# Patient Record
Sex: Female | Born: 1935 | Race: White | Hispanic: No | Marital: Single | Smoking: Never smoker
Health system: Southern US, Community
[De-identification: ages and names within clinical notes are randomized; demographics above are authoritative.]

## PROBLEM LIST (undated history)

## (undated) DIAGNOSIS — I10 Essential (primary) hypertension: Secondary | ICD-10-CM

## (undated) DIAGNOSIS — M1990 Unspecified osteoarthritis, unspecified site: Secondary | ICD-10-CM

## (undated) DIAGNOSIS — F419 Anxiety disorder, unspecified: Secondary | ICD-10-CM

## (undated) DIAGNOSIS — J189 Pneumonia, unspecified organism: Secondary | ICD-10-CM

## (undated) DIAGNOSIS — F329 Major depressive disorder, single episode, unspecified: Secondary | ICD-10-CM

## (undated) DIAGNOSIS — F32A Depression, unspecified: Secondary | ICD-10-CM

---

## 2013-01-15 ENCOUNTER — Inpatient Hospital Stay (HOSPITAL_COMMUNITY): Payer: Medicare Other

## 2013-01-15 ENCOUNTER — Encounter (HOSPITAL_COMMUNITY): Payer: Self-pay | Admitting: *Deleted

## 2013-01-15 ENCOUNTER — Inpatient Hospital Stay (HOSPITAL_COMMUNITY)
Admission: EM | Admit: 2013-01-15 | Discharge: 2013-02-18 | DRG: 871 | Disposition: E | Payer: Medicare Other | Source: Other Acute Inpatient Hospital | Attending: Pulmonary Disease | Admitting: Pulmonary Disease

## 2013-01-15 DIAGNOSIS — H9190 Unspecified hearing loss, unspecified ear: Secondary | ICD-10-CM | POA: Diagnosis present

## 2013-01-15 DIAGNOSIS — E872 Acidosis, unspecified: Secondary | ICD-10-CM | POA: Diagnosis present

## 2013-01-15 DIAGNOSIS — F411 Generalized anxiety disorder: Secondary | ICD-10-CM | POA: Diagnosis present

## 2013-01-15 DIAGNOSIS — J690 Pneumonitis due to inhalation of food and vomit: Secondary | ICD-10-CM | POA: Diagnosis present

## 2013-01-15 DIAGNOSIS — I10 Essential (primary) hypertension: Secondary | ICD-10-CM | POA: Diagnosis present

## 2013-01-15 DIAGNOSIS — R6521 Severe sepsis with septic shock: Secondary | ICD-10-CM

## 2013-01-15 DIAGNOSIS — A419 Sepsis, unspecified organism: Principal | ICD-10-CM | POA: Diagnosis present

## 2013-01-15 DIAGNOSIS — R531 Weakness: Secondary | ICD-10-CM

## 2013-01-15 DIAGNOSIS — J80 Acute respiratory distress syndrome: Secondary | ICD-10-CM | POA: Diagnosis present

## 2013-01-15 DIAGNOSIS — G7281 Critical illness myopathy: Secondary | ICD-10-CM | POA: Diagnosis not present

## 2013-01-15 DIAGNOSIS — Z515 Encounter for palliative care: Secondary | ICD-10-CM

## 2013-01-15 DIAGNOSIS — D649 Anemia, unspecified: Secondary | ICD-10-CM | POA: Diagnosis present

## 2013-01-15 DIAGNOSIS — J9600 Acute respiratory failure, unspecified whether with hypoxia or hypercapnia: Secondary | ICD-10-CM

## 2013-01-15 DIAGNOSIS — G9340 Encephalopathy, unspecified: Secondary | ICD-10-CM | POA: Diagnosis present

## 2013-01-15 DIAGNOSIS — R079 Chest pain, unspecified: Secondary | ICD-10-CM | POA: Diagnosis present

## 2013-01-15 DIAGNOSIS — E870 Hyperosmolality and hypernatremia: Secondary | ICD-10-CM | POA: Diagnosis not present

## 2013-01-15 DIAGNOSIS — M129 Arthropathy, unspecified: Secondary | ICD-10-CM | POA: Diagnosis present

## 2013-01-15 DIAGNOSIS — E875 Hyperkalemia: Secondary | ICD-10-CM | POA: Diagnosis present

## 2013-01-15 DIAGNOSIS — R197 Diarrhea, unspecified: Secondary | ICD-10-CM | POA: Diagnosis not present

## 2013-01-15 DIAGNOSIS — J9601 Acute respiratory failure with hypoxia: Secondary | ICD-10-CM

## 2013-01-15 DIAGNOSIS — J189 Pneumonia, unspecified organism: Secondary | ICD-10-CM | POA: Diagnosis present

## 2013-01-15 DIAGNOSIS — N179 Acute kidney failure, unspecified: Secondary | ICD-10-CM | POA: Diagnosis present

## 2013-01-15 DIAGNOSIS — E876 Hypokalemia: Secondary | ICD-10-CM | POA: Diagnosis present

## 2013-01-15 DIAGNOSIS — E46 Unspecified protein-calorie malnutrition: Secondary | ICD-10-CM | POA: Diagnosis present

## 2013-01-15 DIAGNOSIS — J90 Pleural effusion, not elsewhere classified: Secondary | ICD-10-CM | POA: Diagnosis present

## 2013-01-15 HISTORY — DX: Depression, unspecified: F32.A

## 2013-01-15 HISTORY — DX: Essential (primary) hypertension: I10

## 2013-01-15 HISTORY — DX: Major depressive disorder, single episode, unspecified: F32.9

## 2013-01-15 HISTORY — DX: Anxiety disorder, unspecified: F41.9

## 2013-01-15 HISTORY — DX: Unspecified osteoarthritis, unspecified site: M19.90

## 2013-01-15 HISTORY — DX: Pneumonia, unspecified organism: J18.9

## 2013-01-15 LAB — CARBOXYHEMOGLOBIN
Methemoglobin: 1.7 % — ABNORMAL HIGH (ref 0.0–1.5)
O2 Saturation: 78.5 %

## 2013-01-15 LAB — COMPREHENSIVE METABOLIC PANEL
Albumin: 1.9 g/dL — ABNORMAL LOW (ref 3.5–5.2)
BUN: 45 mg/dL — ABNORMAL HIGH (ref 6–23)
Calcium: 7.2 mg/dL — ABNORMAL LOW (ref 8.4–10.5)
Creatinine, Ser: 1.4 mg/dL — ABNORMAL HIGH (ref 0.50–1.10)
Glucose, Bld: 104 mg/dL — ABNORMAL HIGH (ref 70–99)
Potassium: 5.5 mEq/L — ABNORMAL HIGH (ref 3.5–5.1)
Total Protein: 6.3 g/dL (ref 6.0–8.3)

## 2013-01-15 LAB — INFLUENZA PANEL BY PCR (TYPE A & B)
H1N1 flu by pcr: NOT DETECTED
Influenza B By PCR: NEGATIVE

## 2013-01-15 LAB — CBC WITH DIFFERENTIAL/PLATELET
Basophils Absolute: 0 10*3/uL (ref 0.0–0.1)
Eosinophils Absolute: 0 10*3/uL (ref 0.0–0.7)
Eosinophils Relative: 0 % (ref 0–5)
Lymphs Abs: 0.7 10*3/uL (ref 0.7–4.0)
MCH: 32.1 pg (ref 26.0–34.0)
MCHC: 32.8 g/dL (ref 30.0–36.0)
MCV: 98 fL (ref 78.0–100.0)
Monocytes Absolute: 1 10*3/uL (ref 0.1–1.0)
Platelets: 233 10*3/uL (ref 150–400)
RDW: 14.3 % (ref 11.5–15.5)
WBC Morphology: INCREASED

## 2013-01-15 LAB — BASIC METABOLIC PANEL
BUN: 34 mg/dL — ABNORMAL HIGH (ref 6–23)
CO2: 19 mEq/L (ref 19–32)
Calcium: 7.4 mg/dL — ABNORMAL LOW (ref 8.4–10.5)
Creatinine, Ser: 1.22 mg/dL — ABNORMAL HIGH (ref 0.50–1.10)
GFR calc non Af Amer: 40 mL/min — ABNORMAL LOW (ref >90)
Glucose, Bld: 103 mg/dL — ABNORMAL HIGH (ref 70–99)
Sodium: 139 mEq/L (ref 135–145)

## 2013-01-15 LAB — TROPONIN I
Troponin I: <0.3 ng/mL (ref <0.30)
Troponin I: <0.3 ng/mL (ref <0.30)
Troponin I: <0.3 ng/mL (ref <0.30)

## 2013-01-15 LAB — POCT I-STAT 3, ART BLOOD GAS (G3+)
Acid-base deficit: 9 mmol/L — ABNORMAL HIGH (ref 0.0–2.0)
Bicarbonate: 15.8 mEq/L — ABNORMAL LOW (ref 20.0–24.0)
O2 Saturation: 92 %
TCO2: 17 mmol/L (ref 0–100)
pCO2 arterial: 31.2 mmHg — ABNORMAL LOW (ref 35.0–45.0)
pH, Arterial: 7.313 — ABNORMAL LOW (ref 7.350–7.450)
pO2, Arterial: 68 mmHg — ABNORMAL LOW (ref 80.0–100.0)

## 2013-01-15 LAB — ABO/RH: ABO/RH(D): O POS

## 2013-01-15 LAB — TYPE AND SCREEN: Antibody Screen: NEGATIVE

## 2013-01-15 LAB — MAGNESIUM: Magnesium: 2.3 mg/dL (ref 1.5–2.5)

## 2013-01-15 LAB — STREP PNEUMONIAE URINARY ANTIGEN: Strep Pneumo Urinary Antigen: NEGATIVE

## 2013-01-15 LAB — PHOSPHORUS: Phosphorus: 4.2 mg/dL (ref 2.3–4.6)

## 2013-01-15 MED ORDER — SODIUM CHLORIDE 0.9 % IV SOLN
2.0000 ug/min | INTRAVENOUS | Status: DC
  Administered 2013-01-15 (×2): 6 ug/min via INTRAVENOUS
  Administered 2013-01-16: 2 ug/min via INTRAVENOUS
  Filled 2013-01-15 (×2): qty 4

## 2013-01-15 MED ORDER — PLASMA-LYTE 148 IV SOLN
500.0000 mL | Freq: Once | INTRAVENOUS | Status: DC
  Filled 2013-01-15 (×2): qty 500

## 2013-01-15 MED ORDER — VANCOMYCIN HCL IN DEXTROSE 1-5 GM/200ML-% IV SOLN
1000.0000 mg | INTRAVENOUS | Status: DC
  Administered 2013-01-15 – 2013-01-18 (×4): 1000 mg via INTRAVENOUS
  Filled 2013-01-15 (×4): qty 200

## 2013-01-15 MED ORDER — HEPARIN SODIUM (PORCINE) 5000 UNIT/ML IJ SOLN
5000.0000 [IU] | Freq: Three times a day (TID) | INTRAMUSCULAR | Status: DC
  Administered 2013-01-15 – 2013-01-29 (×43): 5000 [IU] via SUBCUTANEOUS
  Filled 2013-01-15 (×48): qty 1

## 2013-01-15 MED ORDER — ASPIRIN 81 MG PO CHEW
324.0000 mg | CHEWABLE_TABLET | ORAL | Status: AC
  Filled 2013-01-15: qty 4

## 2013-01-15 MED ORDER — SODIUM CHLORIDE 0.9 % IV SOLN
INTRAVENOUS | Status: DC
  Administered 2013-01-15 – 2013-01-16 (×2): via INTRAVENOUS

## 2013-01-15 MED ORDER — DEXTROSE 5 % IV SOLN
500.0000 mg | INTRAVENOUS | Status: DC
  Administered 2013-01-15 – 2013-01-19 (×5): 500 mg via INTRAVENOUS
  Filled 2013-01-15 (×5): qty 500

## 2013-01-15 MED ORDER — DEXTROSE 5 % IV SOLN: 1.0000 g | INTRAVENOUS | Status: DC

## 2013-01-15 MED ORDER — ASPIRIN 300 MG RE SUPP
300.0000 mg | RECTAL | Status: AC
  Filled 2013-01-15: qty 1

## 2013-01-15 MED ORDER — PIPERACILLIN-TAZOBACTAM 3.375 G IVPB
3.3750 g | Freq: Three times a day (TID) | INTRAVENOUS | Status: DC
  Administered 2013-01-15 – 2013-01-25 (×30): 3.375 g via INTRAVENOUS
  Filled 2013-01-15 (×34): qty 50

## 2013-01-15 MED ORDER — SODIUM CHLORIDE 0.9 % IV SOLN
250.0000 mL | INTRAVENOUS | Status: DC | PRN
  Administered 2013-01-19: 20 mL/h via INTRAVENOUS
  Administered 2013-01-22: 04:00:00 via INTRAVENOUS

## 2013-01-15 NOTE — Progress Notes (Signed)
ANTIBIOTIC CONSULT NOTE - INITIAL  Pharmacy Consult for Vancomycin/Zosyn Indication: pneumonia/septic shock  Allergies not on file  Patient Measurements: Height: 5\' 3"  (160 cm) Weight: 145 lb 4.5 oz (65.9 kg) IBW/kg (Calculated) : 52.4 Adjusted Body Weight: n/a  Vital Signs: Temp: 98.7 F (37.1 C) (12/29 0415) Temp src: Oral (12/29 0415) Intake/Output from previous day: 12/28 0701 - 12/29 0700 In: -  Out: 100 [Urine:100] Intake/Output from this shift: Total I/O In: -  Out: 100 [Urine:100]  Labs: No results found for this basename: WBC, HGB, PLT, LABCREA, CREATININE,  in the last 72 hours CrCl is unknown because no creatinine reading has been taken. No results found for this basename: VANCOTROUGH, VANCOPEAK, VANCORANDOM, GENTTROUGH, GENTPEAK, GENTRANDOM, TOBRATROUGH, TOBRAPEAK, TOBRARND, AMIKACINPEAK, AMIKACINTROU, AMIKACIN,  in the last 72 hours   Microbiology: No results found for this or any previous visit (from the past 720 hour(s)).  Medical History: No past medical history on file.  Medications:  Scheduled:  . aspirin  324 mg Oral NOW   Or  . aspirin  300 mg Rectal NOW  . azithromycin  500 mg Intravenous Q24H  . cefTRIAXone (ROCEPHIN)  IV  1 g Intravenous Q24H  . heparin  5,000 Units Subcutaneous Q8H   Assessment: 79 yo female admitted from [facility] ER with PNA and septic shock.  Pharmacy asked to begin empiric vancomycin and zosyn.  She received azithromycin and ceftriaxone at [facility] ~ 2100 12/28.  Scr 1.6, est CrCl ~ 30 ml/min.  From [place]: WBC 30.7, Hgb 12.6, Hct 38.6, Pltc 261  Goal of Therapy:  Vancomycin trough level 15-20 mcg/ml  Plan:  1. Zosyn 3.375g IV q 8 hrs (4 hr extended interval infusion) 2. Vancomycin 1g IV q 24 hrs. 3. Watch renal function, cultures and clinical course. 4. Check vancomycin trough at steady-state as appropriate.  Tad Moore, BCPS  Clinical Pharmacist Pager [phone]  12/23/2012 5:07 AM

## 2013-01-15 NOTE — Progress Notes (Signed)
Utilization Review Completed.  

## 2013-01-15 NOTE — H&P (Signed)
Name: Briana Miranda MRN: [id number] DOB: [birth year]    ADMISSION DATE:  01/13/2013 CONSULTATION DATE:  12/24/2012  REFERRING MD :  [facility] PRIMARY SERVICE: MICU  CHIEF COMPLAINT:  SOB  BRIEF PATIENT DESCRIPTION: Severe PNA with septic shock   SIGNIFICANT EVENTS / STUDIES:  X-ray with dense consolidation   LINES / TUBES: Peripheral IV   CULTURES: Pending   ANTIBIOTICS: Vanc/Zosyn/Azithro   HISTORY OF PRESENT ILLNESS:    Patient is 79 yrs old CF with hx of Arthritis and HTN , lives at home, sent to [place] from [facility] with PNA and septic shock , she started feeling sick few days before christmas , now she cant take it any more , she had respiratory distress and right sided chest pain , fever and cough . She was sent to [place] with Dopamine , now on 6 of levo . She had the Flu , shingles and PNA vaccine she is telling me   PAST MEDICAL HISTORY :  No past medical history on file. No past surgical history on file. Prior to Admission medications   Not on File   Allergies not on file  FAMILY HISTORY:  No family history on file. SOCIAL HISTORY:  has no tobacco, alcohol, and drug history on file.  REVIEW OF SYSTEMS:  As in the HPI , fever , chills , cough , right sided CP , SOB   SUBJECTIVE:   VITAL SIGNS: Temp:  [98.7 F (37.1 C)] 98.7 F (37.1 C) (12/29 0415) Weight:  [65.9 kg (145 lb 4.5 oz)] 65.9 kg (145 lb 4.5 oz) (12/29 0415) HEMODYNAMICS:   VENTILATOR SETTINGS:   INTAKE / OUTPUT: Intake/Output     12/28 0701 - 12/29 0700   Urine (mL/kg/hr) 100   Total Output 100   Net -100         PHYSICAL EXAMINATION: General:  NAD , pleasant and hungry , wants to eat . Neuro:  WNL , AOX3 , EOMI , CN II-XII intact , UL , LL strength is symmetrical and 5/5 HEENT:  atraumatic , no jaundice , dry mucous membranes  Cardiovascular:  Irregular irregular , ESM 2/6 in the aortic area  Lungs: severe crackles on the right side, no wheezing   Abdomen:  Soft lax +BS , no tenderness  . Musculoskeletal:  WNL , normal pulses  Skin:  No rash   LABS:  CBC No results found for this basename: WBC, HGB, HCT, PLT,  in the last 168 hours Coag's No results found for this basename: APTT, INR,  in the last 168 hours BMET No results found for this basename: NA, K, CL, CO2, BUN, CREATININE, GLUCOSE,  in the last 168 hours Electrolytes No results found for this basename: CALCIUM, MG, PHOS,  in the last 168 hours Sepsis Markers No results found for this basename: LATICACIDVEN, PROCALCITON, O2SATVEN,  in the last 168 hours ABG  Recent Labs Lab 12/20/2012 0423  PHART 7.313*  PCO2ART 31.2*  PO2ART 68.0*   Liver Enzymes No results found for this basename: AST, ALT, ALKPHOS, BILITOT, ALBUMIN,  in the last 168 hours Cardiac Enzymes No results found for this basename: TROPONINI, PROBNP,  in the last 168 hours Glucose No results found for this basename: GLUCAP,  in the last 168 hours  Imaging No results found.   CXR: Thick consolidation on the Right   ASSESSMENT / PLAN:  PULMONARY A: Serve respiratory distress, no indication for intubation now , monitor , possibility for necrotizing and or aspiration PNA  P:   CX, Azithro , Zosyn and Vac, wait for Cx   CARDIOVASCULAR A: Shock  P:  Septic shock , serial enzymes and EKG . On low dose pressors for now , resuscitation with Crystalloid and blood if needed , waiting for labs  RENAL A:  Waiting for labs , from the [facility] creatinine is 1.6 , we don't have a baseline  P:   NAI  GASTROINTESTINAL A:  NPO for now NAI   HEMATOLOGIC A:  Leukocytosis , severe  P:  Waiting for labs , will tx if hgb < 7  INFECTIOUS A:  Septic shock , most likely due to PAN   ENDOCRINE A:  NAI   NEUROLOGIC A: NAI   TODAY'S SUMMARY: PNA with septic shock , for the severity of presentation and possibility of necrotizing and aspiration etiology for the PNA will do Vanc/Zosyn and Azithro   I have personally obtained a history, examined the  patient, evaluated laboratory and imaging results, formulated the assessment and plan and placed orders. CRITICAL CARE: The patient is critically ill with multiple organ systems failure and requires high complexity decision making for assessment and support, frequent evaluation and titration of therapies, application of advanced monitoring technologies and extensive interpretation of multiple databases. Critical Care Time devoted to patient care services described in this note is 35 minutes.    Pulmonary and Critical Care Medicine [facility] Pager: [phone]  01/09/2013, 4:38 AM

## 2013-01-15 NOTE — Progress Notes (Signed)
Name: Briana Miranda MRN: [id number] DOB: November 15, 1927    ADMISSION DATE:  01/22/2013 CONSULTATION DATE:  22-Jan-2013  REFERRING MD :  [facility] PRIMARY SERVICE: MICU  CHIEF COMPLAINT:  SOB  BRIEF PATIENT DESCRIPTION: 79 yrs old CF with hx of Arthritis and HTN , lives at home, sent to [place] from [facility] with severe PNA and septic shock  SIGNIFICANT EVENTS / STUDIES:    LINES / TUBES: Peripheral IV   CULTURES: Pending   ANTIBIOTICS: Vanc/Zosyn/Azithro    SUBJECTIVE: afebrile Denies CP Breathing 'better' Hard of hearing  VITAL SIGNS: Temp:  [97.5 F (36.4 C)-98.7 F (37.1 C)] 97.5 F (36.4 C) (12/29 0721) Pulse Rate:  [82-95] 87 (12/29 0900) Resp:  [16-33] 31 (12/29 0900) BP: (98-138)/(49-70) 123/55 mmHg (12/29 0900) SpO2:  [93 %-96 %] 95 % (12/29 0900) FiO2 (%):  [50 %] 50 % (12/29 0900) Weight:  [65.9 kg (145 lb 4.5 oz)] 65.9 kg (145 lb 4.5 oz) (12/29 0415) HEMODYNAMICS:   VENTILATOR SETTINGS: Vent Mode:  [-]  FiO2 (%):  [50 %] 50 % INTAKE / OUTPUT: Intake/Output     12/28 0701 - 12/29 0700 12/29 0701 - 12/30 0700   I.V. (mL/kg) 332.5 (5) 75 (1.1)   IV Piggyback 2050 250   Total Intake(mL/kg) 2382.5 (36.2) 325 (4.9)   Urine (mL/kg/hr) 480 375 (2.5)   Total Output 480 375   Net +1902.5 -50          PHYSICAL EXAMINATION: General:  NAD , pleasant and hungry , wants to eat . Neuro:  WNL , AOX3 , EOMI , CN II-XII intact , UL , LL strength is symmetrical and 5/5 HEENT:  atraumatic , no jaundice , dry mucous membranes  Cardiovascular:  Irregular irregular , ESM 2/6 in the aortic area  Lungs: severe crackles on the right side, clear on left, no wheezing   Abdomen:  Soft lax +BS , no tenderness . Musculoskeletal:  WNL , normal pulses  Skin:  No rash   LABS:  CBC  Recent Labs Lab 01-22-13 0620  WBC 34.8*  HGB 11.4*  HCT 34.8*  PLT 233   Coag's No results found for this basename: APTT, INR,  in the last 168 hours BMET  Recent Labs Lab 22-Jan-2013 0620    NA 137  K 5.5*  CL 105  CO2 17*  BUN 45*  CREATININE 1.40*  GLUCOSE 104*   Electrolytes  Recent Labs Lab 01/22/2013 0620  CALCIUM 7.2*  MG 2.3  PHOS 4.2   Sepsis Markers  Recent Labs Lab 22-Jan-2013 0620  LATICACIDVEN 1.3  PROCALCITON 11.07   ABG  Recent Labs Lab 22-Jan-2013 0423  PHART 7.313*  PCO2ART 31.2*  PO2ART 68.0*   Liver Enzymes  Recent Labs Lab 22-Jan-2013 0620  AST 45*  ALT 18  ALKPHOS 173*  BILITOT 0.5  ALBUMIN 1.9*   Cardiac Enzymes  Recent Labs Lab 2013-01-22 0620  TROPONINI <0.30   Glucose No results found for this basename: GLUCAP,  in the last 168 hours  Imaging Dg Chest Port 1 View  01-22-13   CLINICAL DATA:  Shortness of breath and sepsis.  EXAM: PORTABLE CHEST - 1 VIEW  COMPARISON:  None.  FINDINGS: Shallow inspiration. Cardiac enlargement without vascular congestion. There is elevation of the right hemidiaphragm with a small right pleural effusion and diffuse airspace disease in the right lung. Changes suggest pneumonia or perhaps asymmetric edema.  IMPRESSION: Shallow inspiration with cardiac enlargement. Diffuse infiltration in the right lung with small  right pleural effusion.   Electronically Signed   By: Burman Nieves M.D.   On: 01/21/13 04:43     CXR: dense consolidation on the Right , small effusion  ASSESSMENT / PLAN:  PULMONARY A: Serve respiratory distress, no indication for intubation now , monitor , possibility for necrotizing and or aspiration PNA  P:   CX, Azithro , Zosyn and Vac, wait for Cx  chk rapid flu, urine strep &legionell ag  CARDIOVASCULAR A: Septic Shock  P:  Obtain CVP to guide volume Co-ox x 1  RENAL A:  AKI -? baseline  Hyperkalemia P:   IVFs per CVP, good UO reassuring Rpt BMET  GASTROINTESTINAL A:  NPO   HEMATOLOGIC A:  Leukocytosis , severe  P:   tx if hgb < 7  INFECTIOUS A:  Septic shock , most likely due to PAN  P- Zosyn/ azithro  ENDOCRINE A:  NAI   NEUROLOGIC A: NAI    TODAY'S SUMMARY: PNA with septic shock , for the severity of presentation and possibility of necrotizing and aspiration etiology for the PNA will do Vanc/Zosyn and Azithro  Obtain CVP to guide volume - she appears clinically better than CXR!  I have personally obtained a history, examined the patient, evaluated laboratory and imaging results, formulated the assessment and plan and placed orders. CRITICAL CARE: The patient is critically ill with multiple organ systems failure and requires high complexity decision making for assessment and support, frequent evaluation and titration of therapies, application of advanced monitoring technologies and extensive interpretation of multiple databases. Critical Care Time devoted to patient care services described in this note is 35 minutes.   Isla Pence MD. Tonny Bollman. [place] [facility] Pager [phone] If no response call [phone]    21-Jan-2013, 9:19 AM

## 2013-01-15 NOTE — Procedures (Signed)
Central Venous Catheter Insertion Procedure Note Briana Miranda [id number] March 11, 1927  Procedure: Insertion of Central Venous Catheter Indications: Assessment of intravascular volume, Drug and/or fluid administration and Frequent blood sampling  Procedure Details Consent: Risks of procedure as well as the alternatives and risks of each were explained to the (patient/caregiver).  Consent for procedure obtained.  Time Out: Verified patient identification, verified procedure, site/side was marked, verified correct patient position, special equipment/implants available, medications/allergies/relevent history reviewed, required imaging and test results available.  Performed  Maximum sterile technique was used including antiseptics, cap, gloves, gown, hand hygiene, mask and sheet. Skin prep: Chlorhexidine; local anesthetic administered A triple lumen catheter was placed in the left internal jugular vein to 17 cm using the Seldinger technique.  Evaluation Blood flow good Complications: No apparent complications Patient did tolerate procedure well. Chest X-ray ordered to verify placement.  CXR: pending.  Procedure performed under direct supervision of Dr. Vassie Loll and with ultrasound guidance for real time vessel cannulation.     [name], NP-C [place] [facility] Pgr: [phone] or [phone]  Ultrasound used for site verification, live visualisation of needle entry & guidewire prior to dilation  Briana Miranda V.  12/19/2012, 9:56 AM

## 2013-01-16 ENCOUNTER — Inpatient Hospital Stay (HOSPITAL_COMMUNITY): Payer: Medicare Other

## 2013-01-16 DIAGNOSIS — A419 Sepsis, unspecified organism: Principal | ICD-10-CM

## 2013-01-16 LAB — POCT I-STAT 3, ART BLOOD GAS (G3+)
Acid-base deficit: 6 mmol/L — ABNORMAL HIGH (ref 0.0–2.0)
Bicarbonate: 20.4 mEq/L (ref 20.0–24.0)
Patient temperature: 37
pH, Arterial: 7.297 — ABNORMAL LOW (ref 7.350–7.450)
pO2, Arterial: 69 mmHg — ABNORMAL LOW (ref 80.0–100.0)

## 2013-01-16 LAB — CBC
HCT: 33.2 % — ABNORMAL LOW (ref 36.0–46.0)
Hemoglobin: 11 g/dL — ABNORMAL LOW (ref 12.0–15.0)
MCH: 31.9 pg (ref 26.0–34.0)
MCHC: 33.1 g/dL (ref 30.0–36.0)
MCV: 96.2 fL (ref 78.0–100.0)
Platelets: 273 K/uL (ref 150–400)
RBC: 3.45 MIL/uL — ABNORMAL LOW (ref 3.87–5.11)
RDW: 14.2 % (ref 11.5–15.5)
WBC: 29.1 K/uL — ABNORMAL HIGH (ref 4.0–10.5)

## 2013-01-16 LAB — BASIC METABOLIC PANEL
BUN: 29 mg/dL — ABNORMAL HIGH (ref 6–23)
Calcium: 7.6 mg/dL — ABNORMAL LOW (ref 8.4–10.5)
GFR calc non Af Amer: 45 mL/min — ABNORMAL LOW (ref >90)
Glucose, Bld: 92 mg/dL (ref 70–99)

## 2013-01-16 LAB — LEGIONELLA ANTIGEN, URINE: Legionella Antigen, Urine: NEGATIVE

## 2013-01-16 LAB — URINE CULTURE: Colony Count: NO GROWTH

## 2013-01-16 MED ORDER — NOREPINEPHRINE BITARTRATE 1 MG/ML IJ SOLN
2.0000 ug/min | INTRAMUSCULAR | Status: DC
  Administered 2013-01-17: 10 ug/min via INTRAVENOUS
  Filled 2013-01-16: qty 4

## 2013-01-16 MED ORDER — SODIUM CHLORIDE 0.9 % IV BOLUS (SEPSIS)
500.0000 mL | Freq: Once | INTRAVENOUS | Status: AC
  Administered 2013-01-16: 500 mL via INTRAVENOUS

## 2013-01-16 MED ORDER — POTASSIUM CHLORIDE 10 MEQ/50ML IV SOLN
10.0000 meq | INTRAVENOUS | Status: AC
  Administered 2013-01-16 (×2): 10 meq via INTRAVENOUS
  Filled 2013-01-16 (×2): qty 50

## 2013-01-16 MED ORDER — FUROSEMIDE 10 MG/ML IJ SOLN
INTRAMUSCULAR | Status: AC
  Filled 2013-01-16: qty 4

## 2013-01-16 MED ORDER — FUROSEMIDE 10 MG/ML IJ SOLN
40.0000 mg | Freq: Once | INTRAMUSCULAR | Status: AC
  Administered 2013-01-16: 40 mg via INTRAVENOUS

## 2013-01-16 MED ORDER — LORAZEPAM 2 MG/ML IJ SOLN
0.5000 mg | Freq: Once | INTRAMUSCULAR | Status: AC
  Administered 2013-01-16: 0.5 mg via INTRAVENOUS
  Filled 2013-01-16: qty 1

## 2013-01-16 MED ORDER — CHLORHEXIDINE GLUCONATE 0.12 % MT SOLN
15.0000 mL | Freq: Two times a day (BID) | OROMUCOSAL | Status: DC
  Administered 2013-01-16 – 2013-01-28 (×25): 15 mL via OROMUCOSAL
  Filled 2013-01-16 (×25): qty 15

## 2013-01-16 MED ORDER — FENTANYL CITRATE 0.05 MG/ML IJ SOLN
INTRAMUSCULAR | Status: AC
  Administered 2013-01-16: 100 ug
  Filled 2013-01-16: qty 2

## 2013-01-16 MED ORDER — PANTOPRAZOLE SODIUM 40 MG IV SOLR
40.0000 mg | INTRAVENOUS | Status: DC
  Administered 2013-01-16: 40 mg via INTRAVENOUS
  Filled 2013-01-16 (×2): qty 40

## 2013-01-16 MED ORDER — SODIUM CHLORIDE 0.9 % IV SOLN
25.0000 ug/h | INTRAVENOUS | Status: DC
  Administered 2013-01-16: 100 ug/h via INTRAVENOUS
  Administered 2013-01-17: 75 ug/h via INTRAVENOUS
  Administered 2013-01-19: 25 ug/h via INTRAVENOUS
  Administered 2013-01-20: 50 ug/h via INTRAVENOUS
  Administered 2013-01-21: 125 ug/h via INTRAVENOUS
  Administered 2013-01-22 – 2013-01-24 (×4): 250 ug/h via INTRAVENOUS
  Administered 2013-01-24 (×2): 300 ug/h via INTRAVENOUS
  Administered 2013-01-25: 325 ug/h via INTRAVENOUS
  Administered 2013-01-26: 300 ug/h via INTRAVENOUS
  Administered 2013-01-26: 325 ug/h via INTRAVENOUS
  Filled 2013-01-16 (×16): qty 50

## 2013-01-16 MED ORDER — MIDAZOLAM HCL 2 MG/2ML IJ SOLN
INTRAMUSCULAR | Status: AC
  Administered 2013-01-16: 2 mg
  Filled 2013-01-16: qty 2

## 2013-01-16 MED ORDER — IPRATROPIUM-ALBUTEROL 0.5-2.5 (3) MG/3ML IN SOLN: 3.0000 mL | Freq: Four times a day (QID) | RESPIRATORY_TRACT | Status: DC | PRN

## 2013-01-16 MED ORDER — SODIUM CHLORIDE 0.9 % IV SOLN
0.0000 mg/h | INTRAVENOUS | Status: DC
  Administered 2013-01-16 – 2013-01-17 (×2): 1 mg/h via INTRAVENOUS
  Administered 2013-01-20 (×2): 0.5 mg/h via INTRAVENOUS
  Administered 2013-01-21: 1 mg/h via INTRAVENOUS
  Administered 2013-01-23: 2 mg/h via INTRAVENOUS
  Filled 2013-01-16 (×7): qty 10

## 2013-01-16 MED ORDER — BIOTENE DRY MOUTH MT LIQD
1.0000 "application " | Freq: Four times a day (QID) | OROMUCOSAL | Status: DC
  Administered 2013-01-17 – 2013-01-29 (×50): 15 mL via OROMUCOSAL

## 2013-01-16 NOTE — Progress Notes (Signed)
On 12/30 at 1720 pt still having RR 50-60s, after Ativan 0.5mg  IV given and NRM changed out to Bipap.  Dr. Delton Coombes at [facility] notified of distress.  He sent Dr. Herma Carson over to evaluate pt.  Dr. Herma Carson in room at 1730 and pt intubated by Dr. Herma Carson with RN and RT at bedside with no difficulty with 20mg  of Amidate used for RSI.  Vernon Prey

## 2013-01-16 NOTE — Progress Notes (Signed)
[facility] Physician-Brief Progress Note Patient Name: Briana Miranda DOB: Apr 15, 1927 MRN: [id number]  Date of Service  01/16/2013   HPI/Events of Note   Hypotension post-intubation. Has been diuresed aggressively today  eICU Interventions  Will hold sedating meds for now, give 500cc NS bolus   Intervention Category Major Interventions: Shock - evaluation and management  Stevi Hollinshead S. 01/16/2013, 8:09 PM

## 2013-01-16 NOTE — Progress Notes (Signed)
Name: Briana Miranda MRN: [id number] DOB: 1927-12-07    ADMISSION DATE:  01-23-2013 CONSULTATION DATE:  January 23, 2013  REFERRING MD :  [facility] PRIMARY SERVICE: MICU  CHIEF COMPLAINT:  SOB  BRIEF PATIENT DESCRIPTION: 79 yrs old CF with hx of Arthritis and HTN , lives at home, sent to [place] from [facility] with severe PNA and septic shock  SIGNIFICANT EVENTS / STUDIES:    LINES / TUBES: Peripheral IV   CULTURES: Pending   ANTIBIOTICS: Vanc/Zosyn/Azithro    SUBJECTIVE:  afebrile Denies CP Breathing appears worse -asking for 'something to relax' Hard of hearing  VITAL SIGNS: Temp:  [97.7 F (36.5 C)-101.5 F (38.6 C)] 98 F (36.7 C) (12/30 0743) Pulse Rate:  [80-109] 92 (12/30 1000) Resp:  [18-50] 26 (12/30 1000) BP: (90-173)/(37-94) 140/57 mmHg (12/30 1000) SpO2:  [87 %-100 %] 89 % (12/30 1000) FiO2 (%):  [40 %-100 %] 100 % (12/30 1000) Weight:  [64.4 kg (141 lb 15.6 oz)] 64.4 kg (141 lb 15.6 oz) (12/30 0500) HEMODYNAMICS: CVP:  [7 mmHg-17 mmHg] 14 mmHg VENTILATOR SETTINGS: Vent Mode:  [-]  FiO2 (%):  [40 %-100 %] 100 % INTAKE / OUTPUT: Intake/Output     12/29 0701 - 12/30 0700 12/30 0701 - 12/31 0700   I.V. (mL/kg) 1914.2 (29.7) 225 (3.5)   IV Piggyback 900    Total Intake(mL/kg) 2814.2 (43.7) 225 (3.5)   Urine (mL/kg/hr) 1780 (1.2) 150 (0.6)   Total Output 1780 150   Net +1034.2 +75        Stool Occurrence 1 x      PHYSICAL EXAMINATION: General:  NAD , pleasant , in mild distress Neuro:  WNL , AOX3 , EOMI , CN II-XII intact , UL , LL strength is symmetrical and 5/5 HEENT:  atraumatic , no jaundice , dry mucous membranes  Cardiovascular:  Irregular irregular , ESM 2/6 in the aortic area  Lungs: severe crackles on the right side, clear on left, exp wheezing +  Abdomen:  Soft lax +BS , no tenderness . Musculoskeletal:  WNL , normal pulses  Skin:  No rash   LABS:  CBC  Recent Labs Lab 23-Jan-2013 0620 01/16/13 0412  WBC 34.8* 29.1*  HGB 11.4* 11.0*  HCT  34.8* 33.2*  PLT 233 273   Coag's No results found for this basename: APTT, INR,  in the last 168 hours BMET  Recent Labs Lab January 23, 2013 0620 01-23-2013 1500 01/16/13 0412  NA 137 139 143  K 5.5* 3.4* 3.4*  CL 105 107 109  CO2 17* 19 17*  BUN 45* 34* 29*  CREATININE 1.40* 1.22* 1.10  GLUCOSE 104* 103* 92   Electrolytes  Recent Labs Lab 01/23/13 0620 01-23-2013 1500 01/16/13 0412  CALCIUM 7.2* 7.4* 7.6*  MG 2.3  --   --   PHOS 4.2  --   --    Sepsis Markers  Recent Labs Lab 01-23-2013 0620  LATICACIDVEN 1.3  PROCALCITON 11.07   ABG  Recent Labs Lab 01/23/2013 0423  PHART 7.313*  PCO2ART 31.2*  PO2ART 68.0*   Liver Enzymes  Recent Labs Lab January 23, 2013 0620  AST 45*  ALT 18  ALKPHOS 173*  BILITOT 0.5  ALBUMIN 1.9*   Cardiac Enzymes  Recent Labs Lab 01/23/2013 0620 2013/01/23 1100 01-23-2013 1500  TROPONINI <0.30 <0.30 <0.30   Glucose No results found for this basename: GLUCAP,  in the last 168 hours  Imaging Dg Chest Port 1 View  01/16/2013   CLINICAL DATA:  79 year old female  with pneumonia/ shortness of breath.  EXAM: PORTABLE CHEST - 1 VIEW  COMPARISON:  12/25/2012  FINDINGS: A left IJ central venous catheter with tip overlying the upper SVC/ azygos region again noted.  Bilateral airspace opacities have minimally increased since the prior study - question asymmetric edema and/or pneumonia.  Right basilar atelectasis/ scarring is again noted.  There may be tiny bilateral pleural effusions present.  There is no evidence of pneumothorax.  IMPRESSION: Slightly increasing bilateral airspace opacities -otherwise stable chest radiograph.   Electronically Signed   By: Laveda Abbe M.D.   On: 01/16/2013 09:22   Dg Chest Port 1 View  01/17/2013   CLINICAL DATA:  IJ catheter placement.  EXAM: PORTABLE CHEST - 1 VIEW  COMPARISON:  01/01/2013.  FINDINGS: Interim placement of left IJ catheter, its tip is projected superior vena cava. Right lung diffuse infiltrates again  noted. Small right pleural effusion. Stable cardiomegaly. Normal pulmonary vascularity. No pneumothorax.  IMPRESSION: 1. Interim placement of left IJ line. Its tip is projected over superior vena cava. No complicating features. 2. Right lung diffuse infiltrate unchanged.   Electronically Signed   By: Maisie Fus  Register   On: 12/20/2012 10:26   Dg Chest Port 1 View  12/22/2012   CLINICAL DATA:  Shortness of breath and sepsis.  EXAM: PORTABLE CHEST - 1 VIEW  COMPARISON:  None.  FINDINGS: Shallow inspiration. Cardiac enlargement without vascular congestion. There is elevation of the right hemidiaphragm with a small right pleural effusion and diffuse airspace disease in the right lung. Changes suggest pneumonia or perhaps asymmetric edema.  IMPRESSION: Shallow inspiration with cardiac enlargement. Diffuse infiltration in the right lung with small right pleural effusion.   Electronically Signed   By: Burman Nieves M.D.   On: 12/22/2012 04:43     CXR: dense consolidation on the Right , small effusion, new left infx  ASSESSMENT / PLAN:  PULMONARY A: Serve respiratory distress, no indication for intubation now , monitor , possibility for necrotizing and or aspiration PNA  P:   CX, Azithro , Zosyn and Vac, wait for Cx  Lasix 40 x1 bipap prn  CARDIOVASCULAR A: Septic Shock  P:  Off levophed  RENAL A:  AKI -? baseline  Hyperkalemia P:   IVFs @ 50/h, good UO reassuring Rpt BMET  GASTROINTESTINAL A:  NPO   HEMATOLOGIC A:  Leukocytosis , severe  P:   tx if hgb < 7  INFECTIOUS A:  Septic shock , most likely due to PAN  P- Zosyn/ azithro  ENDOCRINE A:  NAI   NEUROLOGIC A: ativan 0.5 for anxiety  TODAY'S SUMMARY: PNA with septic shock , for the severity of presentation and possibility of necrotizing and aspiration etiology for the PNA will do Vanc/Zosyn and Azithro  D/w daughter carol - intubation ok if needed  I have personally obtained a history, examined the patient, evaluated  laboratory and imaging results, formulated the assessment and plan and placed orders. CRITICAL CARE: The patient is critically ill with multiple organ systems failure and requires high complexity decision making for assessment and support, frequent evaluation and titration of therapies, application of advanced monitoring technologies and extensive interpretation of multiple databases. Critical Care Time devoted to patient care services described in this note is 35 minutes.   Isla Pence MD. Tonny Bollman. [place] [facility] Pager [phone] If no response call [phone]    01/16/2013, 10:54 AM

## 2013-01-16 NOTE — Procedures (Signed)
Name: [name] MRN: [id number] DOB: 24-May-1927   PROCEDURE NOTE  Procedure:  Endotracheal intubation.  Indication:  Acute respiratory failure  Consent:  Consent was implied due to the emergency nature of the procedure.  Anesthesia:  A total of 10 mg of Etomidate was given intravenously.  Procedure summary:  Appropriate equipment was assembled. The patient was identified as Briana Miranda and safety timeout was performed. The patient was placed supine, with head in sniffing position. After adequate level of anesthesia was achieved, a GS3 blade was inserted into the oropharynx and the vocal cords were visualized. A 7.5 endotracheal tube was inserted withouot difficulty and visualized going through the vocal cords. The stylette was removed and cuff inflated. Colorimetric change was noted on the CO2 meter. Breath sounds were heard over both lung fields equally. ETT was secured at 22 lip line.  Post procedure chest xray was ordered.  Complications:  No immediate complications were noted.  Hemodynamic parameters and oxygenation remained stable throughout the procedure.    Orlean Bradford, M.D. Pulmonary and Critical Care Medicine [facility] Pager: [phone]  01/16/2013, 5:53 PM

## 2013-01-16 NOTE — Progress Notes (Signed)
Patient agitated, tachypneic, O2 Sats 84% on 50% Venti mask.  Changed to 100% NRB.  Bilateral breath sounds markedly decreased in bases to 3/4 up lung fields. Audible inspiratory wheezing, coarse rhonchi throughout lung fields. Dr. Vassie Loll notified.  Will round on patient.

## 2013-01-16 NOTE — Evaluation (Signed)
Clinical/Bedside Swallow Evaluation Patient Details  Name: Briana Miranda MRN: [id number] Date of Birth: 1927/03/20  Today's Date: 01/16/2013 Time: 0813-0830 SLP Time Calculation (min): 17 min  Past Medical History:  Past Medical History  Diagnosis Date  . Hypertension   . Anxiety   . Depression   . Pneumonia   . Arthritis    Past Surgical History: History reviewed. No pertinent past surgical history. HPI:  79 yrs old CF with hx of Arthritis and HTN , lives at home, sent to [place] from [facility] with PNA and septic shock , she started feeling sick few days before christmas, she had respiratory distress and right sided chest pain , fever and cough .   CXR 12/29 Shallow inspiration with cardiac enlargement. Diffuse infiltration in the right lung with small right pleural effusion and 12/30    Assessment / Plan / Recommendation Clinical Impression  Pt.'s currently on venti mask and work of breathing increased slightly following thin liquid trials.  Vocal cord abduction is frequent during respiration leading to increased aspiration risk at present.  Pt. appreciative and stated satisfaction with water received that also assited in moisturizing oral and pharyngeal mucousa.  Recommend continue NPO with frequent oral care and ST will return next date.    Aspiration Risk  Severe    Diet Recommendation NPO        Other  Recommendations Oral Care Recommendations: Oral care Q4 per protocol   Follow Up Recommendations  None    Frequency and Duration min 2x/week  2 weeks   Pertinent Vitals/Pain SpO2 slightly dropped         Swallow Study         Oral/Motor/Sensory Function Overall Oral Motor/Sensory Function: Appears within functional limits for tasks assessed   Ice Chips Ice chips: Within functional limits Presentation: Spoon   Thin Liquid Thin Liquid: Impaired Presentation: Cup;Spoon Pharyngeal  Phase Impairments: Change in Vital Signs (desaturation mild, increased WOB)    Nectar  Thick Nectar Thick Liquid: Not tested   Honey Thick Honey Thick Liquid: Not tested   Puree Puree: Not tested   Solid   GO    Solid: Not tested       Royce Macadamia M.Ed [employer] [phone]  01/16/2013

## 2013-01-16 NOTE — Plan of Care (Signed)
Problem: Phase I Progression Outcomes Goal: Sepsis Protocol initiated if indicated Outcome: Progressing Was sepsis protocol prior to intubation Goal: Hemodynamically stable Outcome: Progressing Was wean off of pressors this am, but with intubation this afternoon and fentanyl gtt for sedation, BP has been low, currently trying to find good sedation rate for her Goal: Baseline oxygen/pH stable Outcome: Progressing Since intubated oxygen/pH more stable Goal: Patient tolerating nututrition at goal Outcome: Not Progressing Nutrition needs to be addressed in am Goal: Progressing towards optiumm acitivities Outcome: Not Progressing Due to increased WOB and shortness of breath with RR in 50s, pt had to be intubated Goal: Voiding-avoid urinary catheter unless indicated Outcome: Not Applicable Date Met:  01/16/13 Foley prior to intubation

## 2013-01-16 NOTE — Progress Notes (Signed)
[facility] ADULT ICU REPLACEMENT PROTOCOL FOR AM LAB REPLACEMENT ONLY  The patient does apply for the [facility] Adult ICU Electrolyte Replacment Protocol based on the criteria listed below:   1. Is GFR >/= 40 ml/min? yes  Patient's GFR today is 45 2. Is urine output >/= 0.5 ml/kg/hr for the last 6 hours? yes Patient's UOP is 0.5 ml/kg/hr 3. Is BUN < 60 mg/dL? yes  Patient's BUN today is 29 4. Abnormal electrolyte(s): K 3.4 5. Ordered repletion with: per protocol 6. If a panic level lab has been reported, has the CCM MD in charge been notified? no.   Physician:    Markus Daft A 01/16/2013 5:04 AM

## 2013-01-17 ENCOUNTER — Inpatient Hospital Stay (HOSPITAL_COMMUNITY): Payer: Medicare Other

## 2013-01-17 LAB — BASIC METABOLIC PANEL
CO2: 18 mEq/L — ABNORMAL LOW (ref 19–32)
Calcium: 8 mg/dL — ABNORMAL LOW (ref 8.4–10.5)
Creatinine, Ser: 1.19 mg/dL — ABNORMAL HIGH (ref 0.50–1.10)
GFR calc Af Amer: 48 mL/min — ABNORMAL LOW (ref >90)
GFR calc non Af Amer: 41 mL/min — ABNORMAL LOW (ref >90)
Glucose, Bld: 158 mg/dL — ABNORMAL HIGH (ref 70–99)
Sodium: 148 mEq/L — ABNORMAL HIGH (ref 137–147)

## 2013-01-17 LAB — GLUCOSE, CAPILLARY: Glucose-Capillary: 143 mg/dL — ABNORMAL HIGH (ref 70–99)

## 2013-01-17 LAB — CBC
HCT: 33.4 % — ABNORMAL LOW (ref 36.0–46.0)
MCH: 31.9 pg (ref 26.0–34.0)
MCV: 96.8 fL (ref 78.0–100.0)
Platelets: 331 10*3/uL (ref 150–400)
RBC: 3.45 MIL/uL — ABNORMAL LOW (ref 3.87–5.11)
RDW: 14.5 % (ref 11.5–15.5)

## 2013-01-17 MED ORDER — ADULT MULTIVITAMIN LIQUID CH
5.0000 mL | Freq: Every day | ORAL | Status: DC
  Administered 2013-01-17 – 2013-01-28 (×12): 5 mL
  Filled 2013-01-17 (×14): qty 5

## 2013-01-17 MED ORDER — OXEPA PO LIQD
1000.0000 mL | ORAL | Status: DC
  Administered 2013-01-17 – 2013-01-22 (×6): 1000 mL
  Filled 2013-01-17 (×9): qty 1000

## 2013-01-17 MED ORDER — POTASSIUM CHLORIDE 20 MEQ/15ML (10%) PO LIQD
30.0000 meq | ORAL | Status: AC
  Administered 2013-01-17 (×2): 30 meq
  Filled 2013-01-17 (×2): qty 30

## 2013-01-17 MED ORDER — NOREPINEPHRINE BITARTRATE 1 MG/ML IJ SOLN
2.0000 ug/min | INTRAVENOUS | Status: DC
  Administered 2013-01-17: 20 ug/min via INTRAVENOUS
  Administered 2013-01-19: 5 ug/min via INTRAVENOUS
  Administered 2013-01-20: 4 ug/min via INTRAVENOUS
  Administered 2013-01-22: 6 ug/min via INTRAVENOUS
  Administered 2013-01-24: 2 ug/min via INTRAVENOUS
  Filled 2013-01-17 (×6): qty 16

## 2013-01-17 MED ORDER — PRO-STAT SUGAR FREE PO LIQD
30.0000 mL | Freq: Three times a day (TID) | ORAL | Status: DC
  Administered 2013-01-17 – 2013-01-28 (×36): 30 mL
  Filled 2013-01-17 (×40): qty 30

## 2013-01-17 MED ORDER — PANTOPRAZOLE SODIUM 40 MG PO PACK
40.0000 mg | PACK | Freq: Every day | ORAL | Status: DC
  Administered 2013-01-17 – 2013-01-24 (×8): 40 mg
  Filled 2013-01-17 (×10): qty 20

## 2013-01-17 MED ORDER — ACETAMINOPHEN 160 MG/5ML PO SOLN
650.0000 mg | ORAL | Status: DC | PRN
  Administered 2013-01-17 – 2013-01-27 (×3): 650 mg
  Filled 2013-01-17 (×2): qty 20.3

## 2013-01-17 NOTE — Progress Notes (Signed)
INITIAL NUTRITION ASSESSMENT  DOCUMENTATION CODES Per approved criteria  -Not Applicable   INTERVENTION: Initiate Oxepa formula at 20 ml/hr and increase by 10 ml every 4 hours to goal rate of 40 ml/hr with Prostat liquid protein 30 ml 3 times daily via tube to provide 1740 kcals, 105 gm protein, 754 ml of water Liquid MVI daily via tube to meet RDIs RD to follow for nutrition care plan  NUTRITION DIAGNOSIS: Inadequate oral intake related to inability to eat as evidenced by NPO status  Goal: EN to meet > 90% of estimated nutrition needs  Monitor:  EN regimen & tolerance, respiratory status, weight, labs, I/O's  Reason for Assessment: Consult  79 y.o. female  Admitting Dx: shortness of breath  ASSESSMENT: Patient with PMH of arthritis and HTN , lives at home, presented with severe PNA and septic shock.  Patient is currently intubated on ventilator support -- OGT in place MV: 11.5 L/min Temp (24hrs), Avg:99.3 F (37.4 C), Min:97.6 F (36.4 C), Max:102.1 F (38.9 C)   ARDS Protocol initiated.  Oxepa formula ordered per MD via Adult Tube Feeding Protocol.  RD consulted for EN management.  Height: Ht Readings from Last 1 Encounters:  02/11/13 5\' 3"  (1.6 m)    Weight: Wt Readings from Last 1 Encounters:  01/17/13 141 lb 12.1 oz (64.3 kg)    Ideal Body Weight: 115 lb  % Ideal Body Weight: 123%  Wt Readings from Last 10 Encounters:  01/17/13 141 lb 12.1 oz (64.3 kg)    Usual Body Weight: unable to obtain  % Usual Body Weight: ---  BMI:  Body mass index is 25.12 kg/(m^2).  Estimated Nutritional Needs: Kcal: 1650-1800 Protein: 95-105 gm Fluid: 1.6-1.8 L  Skin: Intact  Diet Order: NPO  EDUCATION NEEDS: -No education needs identified at this time   Intake/Output Summary (Last 24 hours) at 01/17/13 0932 Last data filed at 01/17/13 0900  Gross per 24 hour  Intake 3845.53 ml  Output   3197 ml  Net 648.53 ml    Labs:   Recent Labs Lab  Feb 11, 2013 0620 02-11-2013 1500 01/16/13 0412 01/17/13 0429  NA 137 139 143 148*  K 5.5* 3.4* 3.4* 3.1*  CL 105 107 109 112  CO2 17* 19 17* 18*  BUN 45* 34* 29* 26*  CREATININE 1.40* 1.22* 1.10 1.19*  CALCIUM 7.2* 7.4* 7.6* 8.0*  MG 2.3  --   --   --   PHOS 4.2  --   --   --   GLUCOSE 104* 103* 92 158*    Scheduled Meds: . antiseptic oral rinse  1 application Mouth Rinse QID  . azithromycin  500 mg Intravenous Q24H  . chlorhexidine  15 mL Mouth/Throat BID  . heparin  5,000 Units Subcutaneous Q8H  . pantoprazole sodium  40 mg Per Tube Q1200  . piperacillin-tazobactam (ZOSYN)  IV  3.375 g Intravenous Q8H  . potassium chloride  30 mEq Per Tube Q4H  . vancomycin  1,000 mg Intravenous Q24H    Continuous Infusions: . sodium chloride 75 mL/hr at 01/16/13 0136  . feeding supplement (OXEPA)    . fentaNYL infusion INTRAVENOUS 25 mcg/hr (01/17/13 0900)  . midazolam (VERSED) infusion 1 mg/hr (01/17/13 0900)  . norepinephrine (LEVOPHED) Adult infusion 12 mcg/min (01/17/13 0900)    Past Medical History  Diagnosis Date  . Hypertension   . Anxiety   . Depression   . Pneumonia   . Arthritis     History reviewed. No pertinent past surgical  history.  Maureen Chatters, RD, LDN Pager #: [phone] After-Hours Pager #: [phone]

## 2013-01-17 NOTE — Progress Notes (Signed)
eLink Physician-Brief Progress Note Patient Name: Ramey Ketcherside DOB: 22-Jul-1927 MRN: [id number]  Date of Service  01/17/2013   HPI/Events of Note   fever  eICU Interventions  Tylenol.      YACOUB,WESAM 01/17/2013, 2:52 AM

## 2013-01-17 NOTE — Progress Notes (Signed)
Daughter at the bedside, stated she will take home with her when she leaves pt's dentures and glasses to prevent them from being lost.  RN verified family took these belongings with them when they left.

## 2013-01-17 NOTE — Progress Notes (Signed)
Pt's temp 102.1 axillary. Notified Dr. Molli Knock. Also made him aware of respiratory rate sustaining 35-42 breaths per minute despite sedation. Orders for Tylenol received at this time. Thresa Ross RN

## 2013-01-17 NOTE — Progress Notes (Signed)
Speech Language Pathology Discharge Patient Details Name: Briana Miranda MRN: [id number] DOB: November 03, 1927 Today's Date: 01/17/2013 Time:  -     Patient discharged from SLP services secondary to medical decline - will need to re-order SLP to resume therapy services.  Pt. Intubated.  Please reorder if/when po's are considered.  Please see latest therapy progress note for current level of functioning and progress toward goals.    Progress and discharge plan discussed with patient and/or caregiver: Patient unable to participate in discharge planning and no caregivers available  GO    Royce Macadamia M.Ed CCC-SLP Pager [phone]   01/17/2013, 10:17 AM

## 2013-01-17 NOTE — Progress Notes (Signed)
Name: Briana Miranda MRN: [id number] DOB: 05/23/1927    ADMISSION DATE:  January 16, 2013 CONSULTATION DATE:  01-16-13  REFERRING MD :  [facility] PRIMARY SERVICE: MICU  CHIEF COMPLAINT:  SOB  BRIEF PATIENT DESCRIPTION: 79 yrs old CF with hx of Arthritis and HTN , lives at home, sent to [place] from [facility] with severe PNA and septic shock  SIGNIFICANT EVENTS / STUDIES:    LINES / TUBES: LIJ 12/29 >> ETT 12/30 >.  CULTURES: bld 12/29 >> neg Urine 12/29 >> neg resp 12/31 >>  ANTIBIOTICS: Vanc 12/29 >> Zosyn 12/29 >> Azithro 12/29 >>   SUBJECTIVE:  febrile Intubated for resp distress Hard of hearing Good UO with lasix  VITAL SIGNS: Temp:  [97.6 F (36.4 C)-102.1 F (38.9 C)] 97.6 F (36.4 C) (12/31 0847) Pulse Rate:  [77-126] 87 (12/31 0900) Resp:  [19-48] 30 (12/31 0900) BP: (65-149)/(30-117) 125/59 mmHg (12/31 0900) SpO2:  [82 %-100 %] 99 % (12/31 0900) FiO2 (%):  [60 %-100 %] 60 % (12/31 0900) Weight:  [64.3 kg (141 lb 12.1 oz)] 64.3 kg (141 lb 12.1 oz) (12/31 0500) HEMODYNAMICS: CVP:  [5 mmHg-13 mmHg] 9 mmHg VENTILATOR SETTINGS: Vent Mode:  [-] PRVC FiO2 (%):  [60 %-100 %] 60 % Set Rate:  [18 bmp] 18 bmp Vt Set:  [400 mL] 400 mL PEEP:  [5 cmH20-8 cmH20] 8 cmH20 Pressure Support:  [10 cmH20] 10 cmH20 Plateau Pressure:  [11 cmH20-24 cmH20] 20 cmH20 INTAKE / OUTPUT: Intake/Output     12/30 0701 - 12/31 0700 12/31 0701 - 01/01 0700   I.V. (mL/kg) 2154.2 (33.5) 181.4 (2.8)   NG/GT 30 30   IV Piggyback 1600    Total Intake(mL/kg) 3784.2 (58.9) 211.4 (3.3)   Urine (mL/kg/hr) 2946 (1.9)    Emesis/NG output 350 (0.2)    Stool 1 (0)    Total Output 3297     Net +487.2 +211.4          PHYSICAL EXAMINATION: General:  NAD , pleasant , in mild distress Neuro:  WNL , sedated , non focal HEENT:  atraumatic , no jaundice , dry mucous membranes  Cardiovascular:  Irregular irregular , ESM 2/6 in the aortic area  Lungs: severe crackles BL, no wheezing   Abdomen:  Soft  lax +BS , no tenderness . Musculoskeletal:  WNL , normal pulses  Skin:  No rash   LABS:  CBC  Recent Labs Lab 01/16/2013 0620 01/16/13 0412 01/17/13 0429  WBC 34.8* 29.1* 32.4*  HGB 11.4* 11.0* 11.0*  HCT 34.8* 33.2* 33.4*  PLT 233 273 331   Coag's No results found for this basename: APTT, INR,  in the last 168 hours BMET  Recent Labs Lab 01-16-2013 1500 01/16/13 0412 01/17/13 0429  NA 139 143 148*  K 3.4* 3.4* 3.1*  CL 107 109 112  CO2 19 17* 18*  BUN 34* 29* 26*  CREATININE 1.22* 1.10 1.19*  GLUCOSE 103* 92 158*   Electrolytes  Recent Labs Lab 01-16-13 0620 2013/01/16 1500 01/16/13 0412 01/17/13 0429  CALCIUM 7.2* 7.4* 7.6* 8.0*  MG 2.3  --   --   --   PHOS 4.2  --   --   --    Sepsis Markers  Recent Labs Lab January 16, 2013 0620  LATICACIDVEN 1.3  PROCALCITON 11.07   ABG  Recent Labs Lab 2013/01/16 0423 01/16/13 1857  PHART 7.313* 7.297*  PCO2ART 31.2* 41.6  PO2ART 68.0* 69.0*   Liver Enzymes  Recent Labs Lab 2013-01-16 [id number]  AST 45*  ALT 18  ALKPHOS 173*  BILITOT 0.5  ALBUMIN 1.9*   Cardiac Enzymes  Recent Labs Lab 01/07/2013 0620 01/08/2013 1100 12/24/2012 1500  TROPONINI <0.30 <0.30 <0.30   Glucose No results found for this basename: GLUCAP,  in the last 168 hours  Imaging Dg Chest Port 1 View  01/17/2013   CLINICAL DATA:  Followup pneumonia.  EXAM: PORTABLE CHEST - 1 VIEW  COMPARISON:  01/16/2013.  FINDINGS: Endotracheal tube tip 2.4 cm above the carina.  Left central line tip proximal superior vena cava level. The tip may be density lateral aspect of the superior vena cava.  No gross pneumothorax.  Nasogastric tube courses below the diaphragm. Tip is not included on the present exam.  Patchy asymmetric airspace disease more notable on the right has progressed slightly since the prior examination. This may represent result of infectious infiltrates. Pulmonary edema not excluded in the proper clinical setting.  Cardiomegaly.  Calcified  tortuous aorta.  IMPRESSION: Mild progression of asymmetric airspace disease.  Please see above.   Electronically Signed   By: Bridgett Larsson M.D.   On: 01/17/2013 06:56   Dg Chest Port 1 View  01/16/2013   CLINICAL DATA:  Intubated evaluate tube placement  EXAM: PORTABLE CHEST - 1 VIEW  COMPARISON:  01/16/2013, 12/30/2012  FINDINGS: There is an endotracheal tube with the tip 1.8 cm above the carina. There is a nasogastric tube coursing below the diaphragm with the tip excluded from field of view. There is a left jugular central venous catheter with the tip at the confluence of the subclavian vessels and SVC.  There are bilateral patchy airspace opacities more confluent in the right mid lung most concerning for multilobar pneumonia. There is likely a trace right pleural effusion. There is no left pleural effusion. There is no pneumothorax. Stable cardiac silhouette.  IMPRESSION: 1. Endotracheal tube with the tip 1.8 cm above the carina. 2. Bilateral patchy airspace opacities, more confluent in the right mid lung. The overall appearance is most concerning for multilobar pneumonia.   Electronically Signed   By: Elige Ko   On: 01/16/2013 19:33   Dg Chest Port 1 View  01/16/2013   CLINICAL DATA:  79 year old female with pneumonia/ shortness of breath.  EXAM: PORTABLE CHEST - 1 VIEW  COMPARISON:  12/19/2012  FINDINGS: A left IJ central venous catheter with tip overlying the upper SVC/ azygos region again noted.  Bilateral airspace opacities have minimally increased since the prior study - question asymmetric edema and/or pneumonia.  Right basilar atelectasis/ scarring is again noted.  There may be tiny bilateral pleural effusions present.  There is no evidence of pneumothorax.  IMPRESSION: Slightly increasing bilateral airspace opacities -otherwise stable chest radiograph.   Electronically Signed   By: Laveda Abbe M.D.   On: 01/16/2013 09:22   Dg Chest Port 1 View  12/26/2012   CLINICAL DATA:  IJ catheter  placement.  EXAM: PORTABLE CHEST - 1 VIEW  COMPARISON:  12/25/2012.  FINDINGS: Interim placement of left IJ catheter, its tip is projected superior vena cava. Right lung diffuse infiltrates again noted. Small right pleural effusion. Stable cardiomegaly. Normal pulmonary vascularity. No pneumothorax.  IMPRESSION: 1. Interim placement of left IJ line. Its tip is projected over superior vena cava. No complicating features. 2. Right lung diffuse infiltrate unchanged.   Electronically Signed   By: Maisie Fus  Register   On: 12/19/2012 10:26     CXR: dense consolidation on the Right , small effusion, new  left infx  ASSESSMENT / PLAN:  PULMONARY A: Acute respiratory failure- ?aspiration PNA  ARDS P:   Once FIO2 down to 50%, drop to PEEP 5  CARDIOVASCULAR A: Septic Shock  P:  ct levophed for MAP 60  RENAL A:  AKI -? baseline  Hyperkalemia -resolved, now hypokalemia Metab acidosis P:   IVFs @ 50/h, good UO reassuring Rpt BMET  GASTROINTESTINAL A: start TFs  HEMATOLOGIC A:  Leukocytosis , severe   P:   tx if hgb < 7  INFECTIOUS A:  Septic shock ,  due to Pna P- Zosyn/ azithro Dc vanc once resp cx neg   ENDOCRINE A:  NAI   NEUROLOGIC A: Fent gtt Minimise versed, goal RASS 0 to -1  TODAY'S SUMMARY: PNA with septic shock , ?aspiration etiology , now intubated with septic shock D/w daughter carol  I have personally obtained a history, examined the patient, evaluated laboratory and imaging results, formulated the assessment and plan and placed orders. CRITICAL CARE: The patient is critically ill with multiple organ systems failure and requires high complexity decision making for assessment and support, frequent evaluation and titration of therapies, application of advanced monitoring technologies and extensive interpretation of multiple databases. Critical Care Time devoted to patient care services described in this note is 35 minutes.   Isla Pence MD.  Tonny Bollman. [facility] Pager [phone] If no response call [phone]    01/17/2013, 9:16 AM

## 2013-01-17 NOTE — Progress Notes (Signed)
[facility] ADULT ICU REPLACEMENT PROTOCOL FOR AM LAB REPLACEMENT ONLY  The patient does apply for the [facility] Adult ICU Electrolyte Replacment Protocol based on the criteria listed below:   1. Is GFR >/= 40 ml/min? yes  Patient's GFR today is 41 2. Is urine output >/= 0.5 ml/kg/hr for the last 6 hours? yes Patient's UOP is 0.56 ml/kg/hr 3. Is BUN < 60 mg/dL? yes  Patient's BUN today is 26 4. Abnormal electrolyte(s): K 3.0 5. Ordered repletion with: per protocol 6. If a panic level lab has been reported, has the CCM MD in charge been notified? no.   Physician:    Markus Daft A 01/17/2013 6:02 AM

## 2013-01-18 ENCOUNTER — Inpatient Hospital Stay (HOSPITAL_COMMUNITY): Payer: Medicare Other

## 2013-01-18 LAB — CBC
HCT: 30.6 % — ABNORMAL LOW (ref 36.0–46.0)
Hemoglobin: 9.9 g/dL — ABNORMAL LOW (ref 12.0–15.0)
MCH: 31.4 pg (ref 26.0–34.0)
MCHC: 32.4 g/dL (ref 30.0–36.0)
MCV: 97.1 fL (ref 78.0–100.0)
Platelets: 325 10*3/uL (ref 150–400)
RBC: 3.15 MIL/uL — ABNORMAL LOW (ref 3.87–5.11)
RDW: 14.8 % (ref 11.5–15.5)
WBC: 27.2 10*3/uL — ABNORMAL HIGH (ref 4.0–10.5)

## 2013-01-18 LAB — GLUCOSE, CAPILLARY
GLUCOSE-CAPILLARY: 181 mg/dL — AB (ref 70–99)
GLUCOSE-CAPILLARY: 156 mg/dL — AB (ref 70–99)
GLUCOSE-CAPILLARY: 191 mg/dL — AB (ref 70–99)
Glucose-Capillary: 147 mg/dL — ABNORMAL HIGH (ref 70–99)
Glucose-Capillary: 143 mg/dL — ABNORMAL HIGH (ref 70–99)
Glucose-Capillary: 171 mg/dL — ABNORMAL HIGH (ref 70–99)
Glucose-Capillary: 219 mg/dL — ABNORMAL HIGH (ref 70–99)
Glucose-Capillary: 146 mg/dL — ABNORMAL HIGH (ref 70–99)
Glucose-Capillary: 140 mg/dL — ABNORMAL HIGH (ref 70–99)

## 2013-01-18 LAB — POCT I-STAT 3, ART BLOOD GAS (G3+)
ACID-BASE DEFICIT: 5 mmol/L — AB (ref 0.0–2.0)
ACID-BASE DEFICIT: 5 mmol/L — AB (ref 0.0–2.0)
Acid-base deficit: 6 mmol/L — ABNORMAL HIGH (ref 0.0–2.0)
Acid-base deficit: 5 mmol/L — ABNORMAL HIGH (ref 0.0–2.0)
Acid-base deficit: 5 mmol/L — ABNORMAL HIGH (ref 0.0–2.0)
BICARBONATE: 20.1 meq/L (ref 20.0–24.0)
Bicarbonate: 20.4 mEq/L (ref 20.0–24.0)
Bicarbonate: 21.2 mEq/L (ref 20.0–24.0)
Bicarbonate: 21.7 mEq/L (ref 20.0–24.0)
Bicarbonate: 21.5 mEq/L (ref 20.0–24.0)
O2 SAT: 87 %
O2 Saturation: 78 %
O2 Saturation: 93 %
O2 Saturation: 87 %
O2 Saturation: 91 %
PCO2 ART: 39.5 mmHg (ref 35.0–45.0)
PCO2 ART: 48 mmHg — AB (ref 35.0–45.0)
PH ART: 7.305 — AB (ref 7.350–7.450)
PO2 ART: 46 mmHg — AB (ref 80.0–100.0)
TCO2: 21 mmol/L (ref 0–100)
TCO2: 22 mmol/L (ref 0–100)
TCO2: 22 mmol/L (ref 0–100)
TCO2: 23 mmol/L (ref 0–100)
TCO2: 23 mmol/L (ref 0–100)
pCO2 arterial: 40.4 mmHg (ref 35.0–45.0)
pCO2 arterial: 41 mmHg (ref 35.0–45.0)
pCO2 arterial: 43.4 mmHg (ref 35.0–45.0)
pH, Arterial: 7.32 — ABNORMAL LOW (ref 7.350–7.450)
pH, Arterial: 7.321 — ABNORMAL LOW (ref 7.350–7.450)
pH, Arterial: 7.263 — ABNORMAL LOW (ref 7.350–7.450)
pH, Arterial: 7.303 — ABNORMAL LOW (ref 7.350–7.450)
pO2, Arterial: 71 mmHg — ABNORMAL LOW (ref 80.0–100.0)
pO2, Arterial: 57 mmHg — ABNORMAL LOW (ref 80.0–100.0)
pO2, Arterial: 61 mmHg — ABNORMAL LOW (ref 80.0–100.0)
pO2, Arterial: 66 mmHg — ABNORMAL LOW (ref 80.0–100.0)

## 2013-01-18 LAB — BASIC METABOLIC PANEL
BUN: 32 mg/dL — ABNORMAL HIGH (ref 6–23)
CO2: 21 mEq/L (ref 19–32)
Calcium: 8.3 mg/dL — ABNORMAL LOW (ref 8.4–10.5)
Chloride: 114 mEq/L — ABNORMAL HIGH (ref 96–112)
Creatinine, Ser: 1.13 mg/dL — ABNORMAL HIGH (ref 0.50–1.10)
GFR calc Af Amer: 51 mL/min — ABNORMAL LOW (ref >90)
GFR calc non Af Amer: 44 mL/min — ABNORMAL LOW (ref >90)
Glucose, Bld: 202 mg/dL — ABNORMAL HIGH (ref 70–99)
Potassium: 3.6 mEq/L — ABNORMAL LOW (ref 3.7–5.3)
Sodium: 148 mEq/L — ABNORMAL HIGH (ref 137–147)

## 2013-01-18 LAB — VANCOMYCIN, TROUGH: Vancomycin Tr: 11.2 ug/mL (ref 10.0–20.0)

## 2013-01-18 MED ORDER — VANCOMYCIN HCL IN DEXTROSE 750-5 MG/150ML-% IV SOLN
750.0000 mg | Freq: Two times a day (BID) | INTRAVENOUS | Status: DC
  Administered 2013-01-18 – 2013-01-22 (×8): 750 mg via INTRAVENOUS
  Filled 2013-01-18 (×9): qty 150

## 2013-01-18 MED ORDER — INSULIN ASPART 100 UNIT/ML ~~LOC~~ SOLN
2.0000 [IU] | SUBCUTANEOUS | Status: DC
  Administered 2013-01-18: 4 [IU] via SUBCUTANEOUS
  Administered 2013-01-18: 2 [IU] via SUBCUTANEOUS
  Administered 2013-01-19: 4 [IU] via SUBCUTANEOUS
  Administered 2013-01-19 – 2013-01-20 (×4): 2 [IU] via SUBCUTANEOUS
  Administered 2013-01-20: 4 [IU] via SUBCUTANEOUS
  Administered 2013-01-20 – 2013-01-21 (×5): 2 [IU] via SUBCUTANEOUS
  Administered 2013-01-21: 4 [IU] via SUBCUTANEOUS
  Administered 2013-01-21: 2 [IU] via SUBCUTANEOUS
  Administered 2013-01-22: 4 [IU] via SUBCUTANEOUS
  Administered 2013-01-22 (×3): 2 [IU] via SUBCUTANEOUS
  Administered 2013-01-22: 4 [IU] via SUBCUTANEOUS
  Administered 2013-01-22: 2 [IU] via SUBCUTANEOUS
  Administered 2013-01-23: 4 [IU] via SUBCUTANEOUS
  Administered 2013-01-23 – 2013-01-24 (×6): 2 [IU] via SUBCUTANEOUS
  Administered 2013-01-25 (×2): 4 [IU] via SUBCUTANEOUS
  Administered 2013-01-25 – 2013-01-26 (×2): 2 [IU] via SUBCUTANEOUS
  Administered 2013-01-26 – 2013-01-27 (×2): 4 [IU] via SUBCUTANEOUS
  Administered 2013-01-27: 2 [IU] via SUBCUTANEOUS
  Administered 2013-01-27: 6 [IU] via SUBCUTANEOUS
  Administered 2013-01-28 (×3): 4 [IU] via SUBCUTANEOUS
  Administered 2013-01-29: 2 [IU] via SUBCUTANEOUS

## 2013-01-18 NOTE — Progress Notes (Signed)
CCM MD called ABG  CRITICAL VALUE ALERT  Critical value received:  ABG (pH 7.3, PaCO2 46, PaO2 46, BE -6, HCO3 20.1, SaO2 78)  Date of notification:  01/18/13  Time of notification:  0745  Critical value read back:yes  Nurse who received alert:  P.Alissia Lory  MD notified (1st page):  McQuaid  Time of first page:  0745  MD notified (2nd page):  Time of second page:  Responding MD:  MqQuaid  Time MD responded:  [phone]

## 2013-01-18 NOTE — Progress Notes (Signed)
RT Note: MD notified with current ABG results. MD states pO2 is with adequate range, leave on current vent settings, RT to monitor.

## 2013-01-18 NOTE — Progress Notes (Signed)
CCM MD at bedside, will start patient on ARDS protocol, MD and RN feel it would appropriate to initiate goals of care discussion with patient's family, will talk to family to see what their concerns/thoughts are and will update MD on the situation.  Giving the patient's decline, the goals of care discussion would be beneficial.

## 2013-01-18 NOTE — Progress Notes (Signed)
eLink Physician-Brief Progress Note Patient Name: Briana Miranda DOB: 1928-01-01 MRN: [id number]  Date of Service  01/18/2013   HPI/Events of Note   cbgs high  eICU Interventions  Start icu HG protocol   Intervention Category Intermediate Interventions: Hyperglycemia - evaluation and treatment  Shan Levansatrick Wright 01/18/2013, 8:40 PM

## 2013-01-18 NOTE — Progress Notes (Signed)
ANTIBIOTIC CONSULT NOTE - FOLLOW UP  Pharmacy Consult for Vancomycin and Zosyn Indication: pneumonia / septic shock  No Known Allergies  Patient Measurements: Height: 5\' 3"  (160 cm) Weight: 149 lb 4 oz (67.7 kg) IBW/kg (Calculated) : 52.4  Vital Signs: Temp: 98.3 F (36.8 C) (01/01 0815) Temp src: Oral (01/01 0815) BP: 92/42 mmHg (01/01 0934) Pulse Rate: 84 (01/01 0934) Intake/Output from previous day: 12/31 0701 - 01/01 0700 In: 3071.1 [I.V.:1511.1; NG/GT:1010; IV Piggyback:550] Out: 470 [Urine:470] Intake/Output from this shift: Total I/O In: 101.6 [I.V.:61.6; NG/GT:40] Out: 50 [Urine:50]  Labs:  Recent Labs  01/16/13 0412 01/17/13 0429 01/18/13 0425  WBC 29.1* 32.4* 27.2*  HGB 11.0* 11.0* 9.9*  PLT 273 331 325  CREATININE 1.10 1.19* 1.13*   Estimated Creatinine Clearance: 34.8 ml/min (by C-G formula based on Cr of 1.13).  Recent Labs  01/18/13 0425  VANCOTROUGH 11.2     Microbiology: Recent Results (from the past 720 hour(s))  URINE CULTURE     Status: None   Collection Time    01/04/2013  5:35 AM      Result Value Range Status   Specimen Description URINE, CATHETERIZED   Final   Special Requests NONE   Final   Culture  Setup Time     Final   Value: 01/08/2013 09:11     Performed at [facility] Count     Final   Value: NO GROWTH     Performed at [facility]   Culture     Final   Value: NO GROWTH     Performed at [facility]   Report Status 01/16/2013 FINAL   Final  MRSA PCR SCREENING     Status: None   Collection Time    01/08/2013  6:16 AM      Result Value Range Status   MRSA by PCR NEGATIVE  NEGATIVE Final   Comment:            The GeneXpert MRSA Assay (FDA     approved for NASAL specimens     only), is one component of a     comprehensive MRSA colonization     surveillance program. It is not     intended to diagnose MRSA     infection nor to guide or     monitor treatment for     MRSA infections.   CULTURE, BLOOD (ROUTINE X 2)     Status: None   Collection Time    01/14/2013  8:25 AM      Result Value Range Status   Specimen Description BLOOD LEFT ARM   Final   Special Requests BOTTLES DRAWN AEROBIC ONLY 10CC   Final   Culture  Setup Time     Final   Value: 12/30/2012 16:49     Performed at [facility]   Culture     Final   Value:        BLOOD CULTURE RECEIVED NO GROWTH TO DATE CULTURE WILL BE HELD FOR 5 DAYS BEFORE ISSUING A FINAL NEGATIVE REPORT     Performed at [facility]   Report Status PENDING   Incomplete  CULTURE, BLOOD (ROUTINE X 2)     Status: None   Collection Time    12/20/2012  8:31 AM      Result Value Range Status   Specimen Description BLOOD LEFT HAND   Final   Special Requests BOTTLES DRAWN AEROBIC ONLY 10CC  Final   Culture  Setup Time     Final   Value: 01/11/2013 16:50     [facility]   Culture     Final   Value:        BLOOD CULTURE RECEIVED NO GROWTH TO DATE CULTURE WILL BE HELD FOR 5 DAYS BEFORE ISSUING A FINAL NEGATIVE REPORT     [facility]   Report Status PENDING   Incomplete  CULTURE, RESPIRATORY (NON-EXPECTORATED)     Status: None   Collection Time    01/17/13 10:19 AM      Result Value Range Status   Specimen Description TRACHEAL ASPIRATE   Final   Special Requests NONE   Final   Gram Stain PENDING   Incomplete   Culture     Final   Value: NO GROWTH     [facility]   Report Status PENDING   Incomplete    Anti-infectives   Start     Dose/Rate Route Frequency Ordered Stop   01/08/2013 0600  piperacillin-tazobactam (ZOSYN) IVPB 3.375 g     3.375 g 12.5 mL/hr over 240 Minutes Intravenous 3 times per day 12/23/2012 0508     12/31/2012 0515  vancomycin (VANCOCIN) IVPB 1000 mg/200 mL premix     1,000 mg 200 mL/hr over 60 Minutes Intravenous Every 24 hours 12/29/2012 0508     01/05/2013 0500  cefTRIAXone (ROCEPHIN) 1 g in dextrose 5 % 50 mL IVPB  Status:  Discontinued      1 g 100 mL/hr over 30 Minutes Intravenous Every 24 hours 01/12/2013 0459 01/11/2013 0501   12/19/2012 0500  azithromycin (ZITHROMAX) 500 mg in dextrose 5 % 250 mL IVPB     500 mg 250 mL/hr over 60 Minutes Intravenous Every 24 hours 01/05/2013 0459        Assessment: 80 yo F continues on Vancomycin, Zosyn, and Azithromycin for presumed aspiration pna and septic shock.  Pt has not shown clinical improvement and MD/RN are initiating goals of care discussion with family.  Culture data remains negative.  Renal function remains stable (SCr 1.13) although UOP is low.  Ptis afebrile and WBC trending down.  Vancomycin trough 11.2 (<goal 15-20) on current regimen.  Goal of Therapy:  Vancomycin trough level 15-20 mcg/ml  Plan:  Change Vancomycin to 750 mg IV q12h Continue Zosyn 3.375 gm IV q8h (4 hour infusion). Monitor renal function, culture data, and clinical course. Follow-up for goals of care.   Toys 'R' UsKimberly Demarqus Jocson, Pharm.D., BCPS Clinical Pharmacist Pager [phone] 01/18/2013 9:56 AM

## 2013-01-18 NOTE — Progress Notes (Signed)
Name: Briana Miranda MRN: [id number] DOB: 02-Oct-1927    ADMISSION DATE:  01-19-13 CONSULTATION DATE:  01/19/13  REFERRING MD :  Outside hospital  CHIEF COMPLAINT:  SOB  BRIEF PATIENT DESCRIPTION: 80 yrs old CF with hx of Arthritis and HTN , lives at home, sent to [place] from [facility] with severe PNA and septic shock  SIGNIFICANT EVENTS: 12/29 Transfer to [facility] 12/30 VDRF 01/01 Worsening hypoxia >> change to ARDS protocol  STUDIES:   LINES / TUBES: LIJ 12/29 >> ETT 12/30 >.  CULTURES: bld 12/29 >> neg Urine 12/29 >> neg Legionella 12/29 >> neg Pneumococcal 12/29 >> neg Influenza pcr 12/29 >> neg resp 12/31 >>  ANTIBIOTICS: Vanc 12/29 >> Zosyn 12/29 >> Azithro 12/29 >>  SUBJECTIVE:  Remains on pressors.  Needing more oxygen/PEEP.  VITAL SIGNS: Temp:  [97.5 F (36.4 C)-98.8 F (37.1 C)] 98.3 F (36.8 C) (01/01 0400) Pulse Rate:  [73-108] 91 (01/01 0600) Resp:  [22-42] 29 (01/01 0600) BP: (88-145)/(40-79) 104/49 mmHg (01/01 0600) SpO2:  [80 %-100 %] 97 % (01/01 0600) FiO2 (%):  [50 %-80 %] 60 % (01/01 0500) HEMODYNAMICS: CVP:  [12 mmHg-14 mmHg] 14 mmHg VENTILATOR SETTINGS: Vent Mode:  [-] PRVC FiO2 (%):  [50 %-80 %] 60 % Set Rate:  [18 bmp] 18 bmp Vt Set:  [400 mL] 400 mL PEEP:  [8 cmH20] 8 cmH20 Plateau Pressure:  [20 cmH20-34 cmH20] 23 cmH20 INTAKE / OUTPUT: Intake/Output     12/31 0701 - 01/01 0700   I.V. (mL/kg) 1499.5 (23.3)   NG/GT 970   IV Piggyback 350   Total Intake(mL/kg) 2819.5 (43.8)   Urine (mL/kg/hr) 375 (0.2)   Total Output 375   Net +2444.5         PHYSICAL EXAMINATION: General: ill appearing Neuro: sedated HEENT: ETT in place Cardiovascular:  irregular Lungs: b/l crackles, no wheeze Abdomen: soft, non tender Musculoskeletal:  1+ edema Skin:  No rash   LABS:  CBC  Recent Labs Lab 01/16/13 0412 01/17/13 0429 01/18/13 0425  WBC 29.1* 32.4* 27.2*  HGB 11.0* 11.0* 9.9*  HCT 33.2* 33.4* 30.6*  PLT 273 331 325    Coag's No results found for this basename: APTT, INR,  in the last 168 hours  BMET  Recent Labs Lab 01/16/13 0412 01/17/13 0429 01/18/13 0425  NA 143 148* 148*  K 3.4* 3.1* 3.6*  CL 109 112 114*  CO2 17* 18* 21  BUN 29* 26* 32*  CREATININE 1.10 1.19* 1.13*  GLUCOSE 92 158* 202*   Electrolytes  Recent Labs Lab 01/19/13 0620  01/16/13 0412 01/17/13 0429 01/18/13 0425  CALCIUM 7.2*  < > 7.6* 8.0* 8.3*  MG 2.3  --   --   --   --   PHOS 4.2  --   --   --   --   < > = values in this interval not displayed. Sepsis Markers  Recent Labs Lab Jan 19, 2013 0620  LATICACIDVEN 1.3  PROCALCITON 11.07   ABG  Recent Labs Lab 01/19/13 0423 01/16/13 1857  PHART 7.313* 7.297*  PCO2ART 31.2* 41.6  PO2ART 68.0* 69.0*   Liver Enzymes  Recent Labs Lab 19-Jan-2013 0620  AST 45*  ALT 18  ALKPHOS 173*  BILITOT 0.5  ALBUMIN 1.9*   Cardiac Enzymes  Recent Labs Lab 01/19/13 0620 19-Jan-2013 1100 01/19/2013 1500  TROPONINI <0.30 <0.30 <0.30   Glucose  Recent Labs Lab 01/17/13 1213 01/17/13 1649 01/17/13 2056 01/18/13 0053 01/18/13 0425  GLUCAP 143* 147*  143* 171* 181*    Imaging Dg Chest Port 1 View  01/17/2013   CLINICAL DATA:  Followup pneumonia.  EXAM: PORTABLE CHEST - 1 VIEW  COMPARISON:  01/16/2013.  FINDINGS: Endotracheal tube tip 2.4 cm above the carina.  Left central line tip proximal superior vena cava level. The tip may be density lateral aspect of the superior vena cava.  No gross pneumothorax.  Nasogastric tube courses below the diaphragm. Tip is not included on the present exam.  Patchy asymmetric airspace disease more notable on the right has progressed slightly since the prior examination. This may represent result of infectious infiltrates. Pulmonary edema not excluded in the proper clinical setting.  Cardiomegaly.  Calcified tortuous aorta.  IMPRESSION: Mild progression of asymmetric airspace disease.  Please see above.   Electronically Signed   By: Bridgett LarssonSteve   Olson M.D.   On: 01/17/2013 06:56   Dg Chest Port 1 View  01/16/2013   CLINICAL DATA:  Intubated evaluate tube placement  EXAM: PORTABLE CHEST - 1 VIEW  COMPARISON:  01/16/2013, 06/24/2012  FINDINGS: There is an endotracheal tube with the tip 1.8 cm above the carina. There is a nasogastric tube coursing below the diaphragm with the tip excluded from field of view. There is a left jugular central venous catheter with the tip at the confluence of the subclavian vessels and SVC.  There are bilateral patchy airspace opacities more confluent in the right mid lung most concerning for multilobar pneumonia. There is likely a trace right pleural effusion. There is no left pleural effusion. There is no pneumothorax. Stable cardiac silhouette.  IMPRESSION: 1. Endotracheal tube with the tip 1.8 cm above the carina. 2. Bilateral patchy airspace opacities, more confluent in the right mid lung. The overall appearance is most concerning for multilobar pneumonia.   Electronically Signed   By: Elige KoHetal  Patel   On: 01/16/2013 19:33    ASSESSMENT / PLAN:  PULMONARY A:  Acute respiratory failure with ARDS likely 2nd to aspiration pneumonia. P:   -not ready for vent weaning yet -change to ARDS protocol 1/01 -f/u CXR, ABG  CARDIOVASCULAR A:  Septic shock 2nd to PNA. P:  -wean pressors for MAP goal > 60, SBP > 90  RENAL A: Acute kidney injury from shock and hypoxia - uncertain about baseline renal fx. Hyperkalemia - resolved. Hypokalemia. Metabolic acidosis. P:   -monitor renal fx, urine outpt, electrolytes  GASTROINTESTINAL A:  Nutrition. P: -tube feeds while on vent -protonix for SUP  HEMATOLOGIC A:  Leukocytosis - improving. Anemia of critical illness. P:   -f/u CBC -transfuse for Hb < 7 -SQ heparin for DVT prevention  INFECTIOUS A:   Septic shock 2nd to pneumonia. P: -continue vancomycin, zosyn, zithromax >> narrow abx once cx results final  ENDOCRINE A:   No acute  issues. P: -monitor CBG's while on tube feeds  NEUROLOGIC A:  Acute encephalopathy 2nd to sepsis, respiratory failure, AKI. P: -sedation protocol while on vent  Summary: Concern that she will not be liberated from ventilator quickly, and may need long term vent support.  Will need to goals of care further with family when they are available.  CC time 35 minutes.  Coralyn HellingVineet Kaylem Gidney, MD [facility] Pulmonary/Critical Care 01/18/2013, 6:31 AM Pager:  [phone] After 3pm call: [phone]

## 2013-01-18 DEATH — deceased

## 2013-01-19 ENCOUNTER — Inpatient Hospital Stay (HOSPITAL_COMMUNITY): Payer: Medicare Other

## 2013-01-19 LAB — GLUCOSE, CAPILLARY
GLUCOSE-CAPILLARY: 131 mg/dL — AB (ref 70–99)
GLUCOSE-CAPILLARY: 137 mg/dL — AB (ref 70–99)
GLUCOSE-CAPILLARY: 157 mg/dL — AB (ref 70–99)
Glucose-Capillary: 131 mg/dL — ABNORMAL HIGH (ref 70–99)
Glucose-Capillary: 101 mg/dL — ABNORMAL HIGH (ref 70–99)

## 2013-01-19 LAB — BASIC METABOLIC PANEL
BUN: 37 mg/dL — ABNORMAL HIGH (ref 6–23)
CO2: 23 meq/L (ref 19–32)
CREATININE: 1.03 mg/dL (ref 0.50–1.10)
Calcium: 8.8 mg/dL (ref 8.4–10.5)
Chloride: 113 mEq/L — ABNORMAL HIGH (ref 96–112)
GFR calc non Af Amer: 49 mL/min — ABNORMAL LOW (ref >90)
GFR, EST AFRICAN AMERICAN: 57 mL/min — AB (ref >90)
Glucose, Bld: 145 mg/dL — ABNORMAL HIGH (ref 70–99)
Potassium: 3.6 mEq/L — ABNORMAL LOW (ref 3.7–5.3)
Sodium: 147 mEq/L (ref 137–147)

## 2013-01-19 LAB — CBC
HCT: 30.7 % — ABNORMAL LOW (ref 36.0–46.0)
Hemoglobin: 9.7 g/dL — ABNORMAL LOW (ref 12.0–15.0)
MCH: 31.3 pg (ref 26.0–34.0)
MCHC: 31.6 g/dL (ref 30.0–36.0)
MCV: 99 fL (ref 78.0–100.0)
Platelets: 312 10*3/uL (ref 150–400)
RBC: 3.1 MIL/uL — AB (ref 3.87–5.11)
RDW: 15.1 % (ref 11.5–15.5)
WBC: 27.6 10*3/uL — ABNORMAL HIGH (ref 4.0–10.5)

## 2013-01-19 LAB — BLOOD GAS, ARTERIAL
Acid-base deficit: 3.9 mmol/L — ABNORMAL HIGH (ref 0.0–2.0)
Bicarbonate: 21.6 mEq/L (ref 20.0–24.0)
DRAWN BY: 36496
FIO2: 0.8 %
LHR: 35 {breaths}/min
O2 Saturation: 92.4 %
PCO2 ART: 46.3 mmHg — AB (ref 35.0–45.0)
PEEP/CPAP: 14 cmH2O
PH ART: 7.291 — AB (ref 7.350–7.450)
Patient temperature: 98.6
TCO2: 23 mmol/L (ref 0–100)
VT: 320 mL
pO2, Arterial: 68.7 mmHg — ABNORMAL LOW (ref 80.0–100.0)

## 2013-01-19 LAB — CULTURE, RESPIRATORY W GRAM STAIN: Culture: NO GROWTH

## 2013-01-19 NOTE — Progress Notes (Signed)
Name: Briana Miranda MRN: [id number] DOB: 27-Jan-1927    ADMISSION DATE:  02-01-2013 CONSULTATION DATE:  2013/02/01  REFERRING MD :  Outside hospital  CHIEF COMPLAINT:  SOB  BRIEF PATIENT DESCRIPTION: 80 yrs old CF with hx of Arthritis and HTN , lives at home, sent to [place] from [facility] with severe PNA and septic shock  SIGNIFICANT EVENTS: 12/29 Transfer to [facility] 12/30 VDRF 01/01 Worsening hypoxia >> change to ARDS protocol  STUDIES:   LINES / TUBES: LIJ 12/29 >> ETT 12/30 >.  CULTURES: bld 12/29 >> neg Urine 12/29 >> neg Legionella 12/29 >> neg Pneumococcal 12/29 >> neg Influenza pcr 12/29 >> neg resp 12/31 >>ng  ANTIBIOTICS: Vanc 12/29 >> Zosyn 12/29 >> Azithro 12/29 >>  SUBJECTIVE:  Remains on pressors.   Very high MV on vent, on 6 cc /kg  VITAL SIGNS: Temp:  [98.2 F (36.8 C)-99.3 F (37.4 C)] 98.2 F (36.8 C) (01/02 0846) Pulse Rate:  [41-98] 96 (01/02 0947) Resp:  [20-50] 40 (01/02 0947) BP: (80-119)/(34-63) 118/48 mmHg (01/02 0947) SpO2:  [87 %-100 %] 96 % (01/02 0947) FiO2 (%):  [70 %-80 %] 80 % (01/02 0948) Weight:  [67.5 kg (148 lb 13 oz)] 67.5 kg (148 lb 13 oz) (01/02 0453) HEMODYNAMICS: CVP:  [8 mmHg-20 mmHg] 8 mmHg VENTILATOR SETTINGS: Vent Mode:  [-] PRVC FiO2 (%):  [70 %-80 %] 80 % Set Rate:  [30 bmp-35 bmp] 30 bmp Vt Set:  [320 mL-370 mL] 370 mL PEEP:  [12 cmH20-14 cmH20] 14 cmH20 Plateau Pressure:  [14 cmH20-33 cmH20] 33 cmH20 INTAKE / OUTPUT: Intake/Output     01/01 0701 - 01/02 0700 01/02 0701 - 01/03 0700   I.V. (mL/kg) 1419.7 (21) 110.7 (1.6)   NG/GT 1000 100   IV Piggyback 675 25   Total Intake(mL/kg) 3094.7 (45.8) 235.7 (3.5)   Urine (mL/kg/hr) 795 (0.5) 100 (0.5)   Stool 2 (0)    Total Output 797 100   Net +2297.7 +135.7          PHYSICAL EXAMINATION: General: ill appearing Neuro: unresponsive off sedation HEENT: ETT in place Cardiovascular:  irregular Lungs: b/l crackles, no wheeze Abdomen: soft, non  tender Musculoskeletal:  1+ edema Skin:  No rash   LABS:  CBC  Recent Labs Lab 01/17/13 0429 01/18/13 0425 01/19/13 0425  WBC 32.4* 27.2* 27.6*  HGB 11.0* 9.9* 9.7*  HCT 33.4* 30.6* 30.7*  PLT 331 325 312   Coag's No results found for this basename: APTT, INR,  in the last 168 hours  BMET  Recent Labs Lab 01/17/13 0429 01/18/13 0425 01/19/13 0425  NA 148* 148* 147  K 3.1* 3.6* 3.6*  CL 112 114* 113*  CO2 18* 21 23  BUN 26* 32* 37*  CREATININE 1.19* 1.13* 1.03  GLUCOSE 158* 202* 145*   Electrolytes  Recent Labs Lab Feb 01, 2013 0620  01/17/13 0429 01/18/13 0425 01/19/13 0425  CALCIUM 7.2*  < > 8.0* 8.3* 8.8  MG 2.3  --   --   --   --   PHOS 4.2  --   --   --   --   < > = values in this interval not displayed. Sepsis Markers  Recent Labs Lab 02/01/2013 0620  LATICACIDVEN 1.3  PROCALCITON 11.07   ABG  Recent Labs Lab 01/18/13 1124 01/18/13 1659 01/19/13 0309  PHART 7.263* 7.303* 7.291*  PCO2ART 48.0* 43.4 46.3*  PO2ART 61.0* 66.0* 68.7*   Liver Enzymes  Recent Labs Lab 02-01-2013 [phone]  AST 45*  ALT 18  ALKPHOS 173*  BILITOT 0.5  ALBUMIN 1.9*   Cardiac Enzymes  Recent Labs Lab 12/18/2012 0620 01/04/2013 1100 12/26/2012 1500  TROPONINI <0.30 <0.30 <0.30   Glucose  Recent Labs Lab 01/18/13 1121 01/18/13 1552 01/18/13 2014 01/18/13 2252 01/19/13 0415 01/19/13 0825  GLUCAP 146* 156* 191* 140* 131* 131*    Imaging Dg Chest Port 1 View  01/19/2013   CLINICAL DATA:  Follow-up airspace disease.  EXAM: PORTABLE CHEST - 1 VIEW  COMPARISON:  01/18/2013.  FINDINGS: Rotation to the left.  Endotracheal tube tip 1.7 cm above the carina. To avoid mainstem bronchus intubation with change in patient position, recommend retracting endotracheal tube by 2-3 cm.  Left central line tip proximal superior vena cava level. Tip may be against a lateral aspect of the superior vena cava.  No gross pneumothorax.  Diffuse asymmetric airspace disease relatively  similar to the recent examination may represent asymmetric pulmonary edema although infectious infiltrate not excluded in the proper clinical setting.  Mild cardiomegaly.  Calcified tortuous aorta.  IMPRESSION: Endotracheal tube tip 1.7 cm above the carina. To avoid mainstem bronchus intubation with change in patient position, recommend retracting endotracheal tube by 2-3 cm.  Left central line tip proximal superior vena cava level. Tip may be against a lateral aspect of the superior vena cava.  Diffuse asymmetric airspace disease relatively similar to the recent examination may represent asymmetric pulmonary edema although infectious infiltrate not excluded in the proper clinical setting.  This is a call report.   Electronically Signed   By: Bridgett LarssonSteve  Olson M.D.   On: 01/19/2013 07:52   Dg Chest Port 1 View  01/18/2013   CLINICAL DATA:  Decreased O2 sats  EXAM: PORTABLE CHEST - 1 VIEW  COMPARISON:  01/17/2013  FINDINGS: There is an endotracheal tube with the tip 2.6 cm above the carina. There is a nasogastric tube with the tip coursing below the diaphragm. There is a left-sided jugular central venous catheter with the tip along the right lateral wall of the SVC.  There are bilateral patchy interstitial and alveolar airspace opacities more comparable and in the right upper lobe and left lower lobe. There is a small right pleural effusion. There is no pneumothorax. Stable cardiomediastinal silhouette.  The osseous structures are unremarkable.  IMPRESSION: Bilateral stable interstitial and alveolar airspace opacities involving the upper and lower lung fields most concerning for multilobar pneumonia. No significant interval change compared with 01/17/2013.   Electronically Signed   By: Elige KoHetal  Patel   On: 01/18/2013 08:17    ASSESSMENT / PLAN:  PULMONARY A:  Acute respiratory failure with ARDS likely 2nd to aspiration pneumonia. P:   -changed to ARDS protocol 1/01 -keep on 7cc /kg since plateau pr is low, hope  ths will lower RR, doubt will paralyse since able to suynchronise with sedation alone -f/u CXR, ABG  CARDIOVASCULAR A:  Septic shock 2nd to PNA. P:  -wean pressors for MAP goal > 60, SBP > 90  RENAL A: Acute kidney injury from shock and hypoxia - uncertain about baseline renal fx. Hyperkalemia - resolved. Hypokalemia. Metabolic acidosis. P:   -monitor renal fx, urine outpt, electrolytes  GASTROINTESTINAL A:  Nutrition. P: -tube feeds while on vent -protonix for SUP  HEMATOLOGIC A:  Leukocytosis - improving. Anemia of critical illness. P:   -f/u CBC -transfuse for Hb < 7 -SQ heparin for DVT prevention  INFECTIOUS A:   Septic shock 2nd to pneumonia. P: -continue vancomycin, zosyn,  zithromax >> narrow abx once cx results final  ENDOCRINE A:   No acute issues. P: -monitor CBG's while on tube feeds  NEUROLOGIC A:  Acute encephalopathy 2nd to sepsis, respiratory failure, AKI. P: -sedation protocol while on vent  Summary: ARDS ,expect prolonged ventilation & recovery D/w daughter Okey Regal , continue current efforts over weekend & readdress goals of care by Monday if no progress  The patient is critically ill with multiple organ systems failure and requires high complexity decision making for assessment and support, frequent evaluation and titration of therapies, application of advanced monitoring technologies and extensive interpretation of multiple databases. Critical Care Time devoted to patient care services described in this note is 35 minutes.    Cyril Mourning MD. Tonny Bollman.  Pulmonary & Critical care Pager [phone] If no response call [phone]   01/19/2013, 10:00 AM

## 2013-01-20 ENCOUNTER — Inpatient Hospital Stay (HOSPITAL_COMMUNITY): Payer: Medicare Other

## 2013-01-20 DIAGNOSIS — G9340 Encephalopathy, unspecified: Secondary | ICD-10-CM | POA: Diagnosis present

## 2013-01-20 DIAGNOSIS — J9600 Acute respiratory failure, unspecified whether with hypoxia or hypercapnia: Secondary | ICD-10-CM | POA: Diagnosis present

## 2013-01-20 DIAGNOSIS — I369 Nonrheumatic tricuspid valve disorder, unspecified: Secondary | ICD-10-CM

## 2013-01-20 LAB — GLUCOSE, CAPILLARY
GLUCOSE-CAPILLARY: 146 mg/dL — AB (ref 70–99)
GLUCOSE-CAPILLARY: 136 mg/dL — AB (ref 70–99)
Glucose-Capillary: 117 mg/dL — ABNORMAL HIGH (ref 70–99)
Glucose-Capillary: 128 mg/dL — ABNORMAL HIGH (ref 70–99)
Glucose-Capillary: 135 mg/dL — ABNORMAL HIGH (ref 70–99)
Glucose-Capillary: 157 mg/dL — ABNORMAL HIGH (ref 70–99)

## 2013-01-20 LAB — CBC
HEMATOCRIT: 29.2 % — AB (ref 36.0–46.0)
Hemoglobin: 9.8 g/dL — ABNORMAL LOW (ref 12.0–15.0)
MCH: 33 pg (ref 26.0–34.0)
MCHC: 33.6 g/dL (ref 30.0–36.0)
MCV: 98.3 fL (ref 78.0–100.0)
Platelets: 312 10*3/uL (ref 150–400)
RBC: 2.97 MIL/uL — ABNORMAL LOW (ref 3.87–5.11)
RDW: 15.1 % (ref 11.5–15.5)
WBC: 21.9 10*3/uL — ABNORMAL HIGH (ref 4.0–10.5)

## 2013-01-20 LAB — BLOOD GAS, ARTERIAL
Acid-base deficit: 1.8 mmol/L (ref 0.0–2.0)
BICARBONATE: 24 meq/L (ref 20.0–24.0)
Drawn by: 36496
FIO2: 1 %
MECHVT: 370 mL
O2 SAT: 94.9 %
PCO2 ART: 54.2 mmHg — AB (ref 35.0–45.0)
PEEP/CPAP: 16 cmH2O
PO2 ART: 85.4 mmHg (ref 80.0–100.0)
Patient temperature: 99.7
RATE: 28 resp/min
TCO2: 25.6 mmol/L (ref 0–100)
pH, Arterial: 7.273 — ABNORMAL LOW (ref 7.350–7.450)

## 2013-01-20 LAB — BASIC METABOLIC PANEL
BUN: 42 mg/dL — AB (ref 6–23)
CHLORIDE: 116 meq/L — AB (ref 96–112)
CO2: 24 mEq/L (ref 19–32)
CREATININE: 1 mg/dL (ref 0.50–1.10)
Calcium: 8.8 mg/dL (ref 8.4–10.5)
GFR calc Af Amer: 59 mL/min — ABNORMAL LOW (ref >90)
GFR, EST NON AFRICAN AMERICAN: 51 mL/min — AB (ref >90)
Glucose, Bld: 146 mg/dL — ABNORMAL HIGH (ref 70–99)
Potassium: 3.5 mEq/L — ABNORMAL LOW (ref 3.7–5.3)
Sodium: 150 mEq/L — ABNORMAL HIGH (ref 137–147)

## 2013-01-20 LAB — CLOSTRIDIUM DIFFICILE BY PCR: CDIFFPCR: NEGATIVE

## 2013-01-20 LAB — VANCOMYCIN, TROUGH: VANCOMYCIN TR: 21.7 ug/mL — AB (ref 10.0–20.0)

## 2013-01-20 MED ORDER — FREE WATER
300.0000 mL | Freq: Four times a day (QID) | Status: DC
  Administered 2013-01-20 – 2013-01-21 (×5): 300 mL

## 2013-01-20 MED ORDER — POTASSIUM CHLORIDE 10 MEQ/50ML IV SOLN
10.0000 meq | INTRAVENOUS | Status: AC
  Administered 2013-01-20 (×2): 10 meq via INTRAVENOUS

## 2013-01-20 MED ORDER — POTASSIUM CHLORIDE 10 MEQ/50ML IV SOLN
INTRAVENOUS | Status: AC
  Filled 2013-01-20: qty 100

## 2013-01-20 NOTE — Progress Notes (Signed)
ANTIBIOTIC CONSULT NOTE - FOLLOW UP  Pharmacy Consult for vancomycin Indication: pneumonia and sepsis  Labs:  Recent Labs  01/18/13 0425 01/19/13 0425 01/20/13 0345  WBC 27.2* 27.6* 21.9*  HGB 9.9* 9.7* 9.8*  PLT 325 312 312  CREATININE 1.13* 1.03 1.00   Estimated Creatinine Clearance: 39.4 ml/min (by C-G formula based on Cr of 1).  Recent Labs  01/18/13 0425 01/20/13 0345  VANCOTROUGH 11.2 21.7*     Microbiology: Recent Results (from the past 720 hour(s))  URINE CULTURE     Status: None   Collection Time    12/27/2012  5:35 AM      Result Value Range Status   Specimen Description URINE, CATHETERIZED   Final   Special Requests NONE   Final   Culture  Setup Time     Final   Value: 12/31/2012 09:11     Performed at [facility]   Colony Count     Final   Value: NO GROWTH     [facility]formed [facility]   Culture     Final   Value: NO GROWTH     Performed at [facility]   Report Status 01/16/2013 FINAL   Final  MRSA PCR SCREENING     Status: None   Collection Time    12/31/2012  6:16 AM      Result Value Range Status   MRSA by PCR NEGATIVE  NEGATIVE Final   Comment:            The GeneXpert MRSA Assay (FDA     approved for NASAL specimens     only), is one component of a     comprehensive MRSA colonization     surveillance program. It is not     intended to diagnose MRSA     infection nor to guide or     monitor treatment for     MRSA infections.  CULTURE, BLOOD (ROUTINE X 2)     Status: None   Collection Time    01/17/2013  8:25 AM      Result Value Range Status   Specimen Description BLOOD LEFT ARM   Final   Special Requests BOTTLES DRAWN AEROBIC ONLY 10CC   Final   Culture  Setup Time     Final   Value: 01/04/2013 16:49     Performed at [facility]   Culture     Final   Value:        BLOOD CULTURE RECEIVED NO GROWTH TO DATE CULTURE WILL BE HELD FOR 5 DAYS BEFORE ISSUING A FINAL NEGATIVE REPORT     Performed at [facility]   Report Status PENDING   Incomplete  CULTURE, BLOOD (ROUTINE X 2)     Status: None   Collection Time    01/07/2013  8:31 AM      Result Value Range Status   Specimen Description BLOOD LEFT HAND   Final   Special Requests BOTTLES DRAWN AEROBIC ONLY 10CC   Final   Culture  Setup Time     Final   Value: 12/20/2012 16:50     Performed at [facility]   Culture     Final   Value:        BLOOD CULTURE RECEIVED NO GROWTH TO DATE CULTURE WILL BE HELD FOR 5 DAYS BEFORE ISSUING A FINAL NEGATIVE REPORT     Performed at [facility]   Report Status PENDING  Incomplete  CULTURE, RESPIRATORY (NON-EXPECTORATED)     Status: None   Collection Time    01/17/13 10:19 AM      Result Value Range Status   Specimen Description TRACHEAL ASPIRATE   Final   Special Requests NONE   Final   Gram Stain     Final   Value: FEW WBC PRESENT, PREDOMINANTLY PMN     RARE SQUAMOUS EPITHELIAL CELLS PRESENT     NO ORGANISMS SEEN     Performed at [facility]   Culture     Final   Value: NO GROWTH 2 DAYS     Performed at [facility]   Report Status 01/19/2013 FINAL   Final    Anti-infectives   Start     Dose/Rate Route Frequency Ordered Stop   01/18/13 1800  vancomycin (VANCOCIN) IVPB 750 mg/150 ml premix     750 mg 150 mL/hr over 60 Minutes Intravenous Every 12 hours 01/18/13 0958     12/27/2012 0600  piperacillin-tazobactam (ZOSYN) IVPB 3.375 g     3.375 g 12.5 mL/hr over 240 Minutes Intravenous 3 times per day 12/21/2012 0508     01/16/2013 0515  vancomycin (VANCOCIN) IVPB 1000 mg/200 mL premix  Status:  Discontinued     1,000 mg 200 mL/hr over 60 Minutes Intravenous Every 24 hours 01/11/2013 0508 01/18/13 0958   01/04/2013 0500  cefTRIAXone (ROCEPHIN) 1 g in dextrose 5 % 50 mL IVPB  Status:  Discontinued     1 g 100 mL/hr over 30 Minutes Intravenous Every 24 hours 12/24/2012 0459 01/13/2013 0501   12/29/2012 0500  azithromycin (ZITHROMAX) 500 mg in dextrose 5 % 250 mL IVPB   Status:  Discontinued     500 mg 250 mL/hr over 60 Minutes Intravenous Every 24 hours 01/14/2013 0459 01/19/13 1010      Assessment/Plan: 80yo female w/ therapeutic vancomycin trough (resulted as 21.7, drawn early, extrapolates to ~16, goal 15-20), remains critically ill.  Will continue vancomycin for now and continue to monitor.   Vernard Gambles, PharmD, BCPS  01/20/2013,4:52 AM

## 2013-01-20 NOTE — Progress Notes (Signed)
eLink Physician-Brief Progress Note Patient Name: Briana Miranda DOB: 11-May-1927 MRN: [id number]  Date of Service  01/20/2013   HPI/Events of Note   Desaturating.  Positional.  eICU Interventions  PEEP and FiO2 increased.      Briana Miranda 01/20/2013, 2:27 AM

## 2013-01-20 NOTE — Progress Notes (Signed)
  Echocardiogram 2D Echocardiogram has been performed.  Georgian CoWILLIAMS, Thalya Fouche 01/20/2013, 4:44 PM

## 2013-01-20 NOTE — Progress Notes (Signed)
Name: Briana Miranda MRN: [id number] DOB: 1927-12-09    ADMISSION DATE:  2012-11-02 CONSULTATION DATE:  2012-11-02  REFERRING MD :  Outside hospital  CHIEF COMPLAINT:  SOB  BRIEF PATIENT DESCRIPTION: 80 yrs old CF with hx of Arthritis and HTN , lives at home, sent to us from [facility] with severe PNA and septic shock  SIGNIFICANT EVENTS: [facility]29 [facility] VDRF 01/01 Worsening hypoxia >> change to ARDS protocol  STUDIES:   LINES / TUBES: LIJ 12/29 >> ETT 12/30 >.  CULTURES: bld 12/29 >> neg Urine 12/29 >> neg Legionella 12/29 >> neg Pneumococcal 12/29 >> neg Influenza pcr 12/29 >> neg resp 12/31 >>ng  ANTIBIOTICS: Vanc 12/29 >> Zosyn 12/29 >> Azithro 12/29 >>  SUBJECTIVE:  No significant changes > remains on high FiO2 and PEEP, on pressors.  Poor MS even off versed Frequent diarrhea  VITAL SIGNS: Temp:  [96.8 F (36 C)-99.7 F (37.6 C)] 97.7 F (36.5 C) (01/03 0732) Pulse Rate:  [42-123] 87 (01/03 0740) Resp:  [19-50] 35 (01/03 0740) BP: (83-148)/(32-103) 112/51 mmHg (01/03 0740) SpO2:  [84 %-100 %] 99 % (01/03 0740) FiO2 (%):  [70 %-100 %] 100 % (01/03 0740) Weight:  [68.7 kg (151 lb 7.3 oz)] 68.7 kg (151 lb 7.3 oz) (01/03 0430) HEMODYNAMICS: CVP:  [8 mmHg-12 mmHg] 8 mmHg VENTILATOR SETTINGS: Vent Mode:  [-] PRVC FiO2 (%):  [70 %-100 %] 100 % Set Rate:  [28 bmp-35 bmp] 28 bmp Vt Set:  [320 mL-370 mL] 370 mL PEEP:  [14 cmH20-16 cmH20] 16 cmH20 Plateau Pressure:  [21 cmH20-33 cmH20] 25 cmH20 INTAKE / OUTPUT: Intake/Output     01/02 0701 - 01/03 0700 01/03 0701 - 01/04 0700   I.V. (mL/kg) 1142.6 (16.6)    NG/GT 1040    IV Piggyback 500    Total Intake(mL/kg) 2682.6 (39)    Urine (mL/kg/hr) 1005 (0.6)    Stool 3 (0)    Total Output 1008     Net +1674.6          Stool Occurrence 1 x      PHYSICAL EXAMINATION: General: ill appearing Neuro: unresponsive off sedation HEENT: ETT in place Cardiovascular:  irregular Lungs: b/l crackles,  no wheeze Abdomen: soft, non tender Musculoskeletal:  1+ edema Skin:  No rash   LABS:  CBC  Recent Labs Lab 01/18/13 0425 01/19/13 0425 01/20/13 0345  WBC 27.2* 27.6* 21.9*  HGB 9.9* 9.7* 9.8*  HCT 30.6* 30.7* 29.2*  PLT 325 312 312   Coag's No results found for this basename: APTT, INR,  in the last 168 hours  BMET  Recent Labs Lab 01/18/13 0425 01/19/13 0425 01/20/13 0345  NA 148* 147 150*  K 3.6* 3.6* 3.5*  CL 114* 113* 116*  CO2 21 23 24   BUN 32* 37* 42*  CREATININE 1.13* 1.03 1.00  GLUCOSE 202* 145* 146*   Electrolytes  Recent Labs Lab May 21, 2012 0620  01/18/13 0425 01/19/13 0425 01/20/13 0345  CALCIUM 7.2*  < > 8.3* 8.8 8.8  MG 2.3  --   --   --   --   PHOS 4.2  --   --   --   --   < > = values in this interval not displayed. Sepsis Markers  Recent Labs Lab May 21, 2012 0620  LATICACIDVEN 1.3  PROCALCITON 11.07   ABG  Recent Labs Lab 01/18/13 1659 01/19/13 0309 01/20/13 0408  PHART 7.303* 7.291* 7.273*  PCO2ART 43.4 46.3* 54.2*  PO2ART 66.0*  68.7* 85.4   Liver Enzymes  Recent Labs Lab January 26, 2013 0620  AST 45*  ALT 18  ALKPHOS 173*  BILITOT 0.5  ALBUMIN 1.9*   Cardiac Enzymes  Recent Labs Lab January 26, 2013 0620 2013/01/26 1100 2013/01/26 1500  TROPONINI <0.30 <0.30 <0.30   Glucose  Recent Labs Lab 01/19/13 1139 01/19/13 1606 01/19/13 1945 01/19/13 2343 01/20/13 0357 01/20/13 0733  GLUCAP 101* 137* 157* 117* 128* 146*    Imaging Dg Chest Port 1 View  01/19/2013   CLINICAL DATA:  Follow-up airspace disease.  EXAM: PORTABLE CHEST - 1 VIEW  COMPARISON:  01/18/2013.  FINDINGS: Rotation to the left.  Endotracheal tube tip 1.7 cm above the carina. To avoid mainstem bronchus intubation with change in patient position, recommend retracting endotracheal tube by 2-3 cm.  Left central line tip proximal superior vena cava level. Tip may be against a lateral aspect of the superior vena cava.  No gross pneumothorax.  Diffuse asymmetric  airspace disease relatively similar to the recent examination may represent asymmetric pulmonary edema although infectious infiltrate not excluded in the proper clinical setting.  Mild cardiomegaly.  Calcified tortuous aorta.  IMPRESSION: Endotracheal tube tip 1.7 cm above the carina. To avoid mainstem bronchus intubation with change in patient position, recommend retracting endotracheal tube by 2-3 cm.  Left central line tip proximal superior vena cava level. Tip may be against a lateral aspect of the superior vena cava.  Diffuse asymmetric airspace disease relatively similar to the recent examination may represent asymmetric pulmonary edema although infectious infiltrate not excluded in the proper clinical setting.  This is a call report.   Electronically Signed   By: Bridgett Larsson M.D.   On: 01/19/2013 07:52    ASSESSMENT / PLAN:  PULMONARY A:  Acute respiratory failure with ARDS likely 2nd to aspiration pneumonia. P:   -changed to ARDS protocol 1/01  -remains on high FiO2 and PEEP -f/u CXR, ABG  CARDIOVASCULAR A:  Septic shock 2nd to PNA, sedation requirements also contributing to hypotension P:  -wean pressors for MAP goal > 60, SBP > 90  - check TTE  RENAL A: Acute kidney injury from shock and hypoxia - uncertain about baseline renal fx. Hyperkalemia - resolved. Hypokalemia. Hypernatremia Metabolic acidosis. P:   - free water 1/3 -monitor renal fx, urine outpt, electrolytes  GASTROINTESTINAL A:  Nutrition. Diarrhea  P: - place rectal pouch - check c diff  -tube feeds while on vent -protonix for SUP  HEMATOLOGIC A:  Leukocytosis  Anemia of critical illness. P:   -f/u CBC -transfuse for Hb < 7 -SQ heparin for DVT prevention  INFECTIOUS A:   Septic shock 2nd to pneumonia. Cx's negative P: -continue vancomycin, zosyn, zithromax > day 6 of 8 on 1/3   ENDOCRINE A:   No acute issues. P: -monitor CBG's while on tube feeds  NEUROLOGIC A:  Acute  encephalopathy 2nd to sepsis, respiratory failure, AKI. P: -sedation protocol while on vent  Summary: ARDS ,expect prolonged ventilation & recovery Dr Vassie Loll D/w daughter Okey Regal , continue current efforts over weekend & readdress goals of care by Monday if no progress  The patient is critically ill with multiple organ systems failure and requires high complexity decision making for assessment and support, frequent evaluation and titration of therapies, application of advanced monitoring technologies and extensive interpretation of multiple databases. Critical Care Time devoted to patient care services described in this note is 35 minutes.    Levy Pupa, MD, PhD 01/20/2013, 8:50 AM [place] [facility] [phone] or if no answer [phone]

## 2013-01-20 NOTE — Progress Notes (Signed)
[facility]OC[facility] FOR AM LAB REPLACEMENT ONLY  The patient doesapply for the [facility] Electrolyte [facility] Protocol based on the criteria listed below:   1. Is GFR >/= 40 ml/min? yes  Patient's GFR today is 51 2. Is urine output >/= 0.5 ml/kg/hr for the last 6 hours? yes Patient's UOP is .60 ml/kg/hr 3. Is BUN < 60 mg/dL? yes  Patient's BUN today is 42 4. Abnormal electrolyte(s): K+ 3.5 5. Ordered repletion with: see order 6. If a panic level lab has been reported, has the CCM MD in charge been notified? yes.   Physician:  Dr. Frances NickelsYacoub  Annia Gomm A 01/20/2013 6:38 AM

## 2013-01-21 LAB — BASIC METABOLIC PANEL
BUN: 41 mg/dL — AB (ref 6–23)
CALCIUM: 8.8 mg/dL (ref 8.4–10.5)
CO2: 27 mEq/L (ref 19–32)
CREATININE: 0.95 mg/dL (ref 0.50–1.10)
Chloride: 110 mEq/L (ref 96–112)
GFR calc Af Amer: 62 mL/min — ABNORMAL LOW (ref >90)
GFR calc non Af Amer: 54 mL/min — ABNORMAL LOW (ref >90)
Glucose, Bld: 131 mg/dL — ABNORMAL HIGH (ref 70–99)
Potassium: 4.1 mEq/L (ref 3.7–5.3)
Sodium: 146 mEq/L (ref 137–147)

## 2013-01-21 LAB — CULTURE, BLOOD (ROUTINE X 2)
Culture: NO GROWTH
Culture: NO GROWTH

## 2013-01-21 LAB — GLUCOSE, CAPILLARY
GLUCOSE-CAPILLARY: 113 mg/dL — AB (ref 70–99)
GLUCOSE-CAPILLARY: 137 mg/dL — AB (ref 70–99)
Glucose-Capillary: 114 mg/dL — ABNORMAL HIGH (ref 70–99)
Glucose-Capillary: 121 mg/dL — ABNORMAL HIGH (ref 70–99)
Glucose-Capillary: 138 mg/dL — ABNORMAL HIGH (ref 70–99)
Glucose-Capillary: 163 mg/dL — ABNORMAL HIGH (ref 70–99)

## 2013-01-21 LAB — POCT I-STAT 3, ART BLOOD GAS (G3+)
ACID-BASE DEFICIT: 1 mmol/L (ref 0.0–2.0)
Bicarbonate: 25.6 mEq/L — ABNORMAL HIGH (ref 20.0–24.0)
O2 Saturation: 82 %
PCO2 ART: 55.8 mmHg — AB (ref 35.0–45.0)
PO2 ART: 58 mmHg — AB (ref 80.0–100.0)
Patient temperature: 101.2
TCO2: 27 mmol/L (ref 0–100)
pH, Arterial: 7.277 — ABNORMAL LOW (ref 7.350–7.450)

## 2013-01-21 LAB — CBC
HEMATOCRIT: 32.5 % — AB (ref 36.0–46.0)
Hemoglobin: 9.9 g/dL — ABNORMAL LOW (ref 12.0–15.0)
MCH: 30.8 pg (ref 26.0–34.0)
MCHC: 30.5 g/dL (ref 30.0–36.0)
MCV: 101.2 fL — AB (ref 78.0–100.0)
Platelets: 308 10*3/uL (ref 150–400)
RBC: 3.21 MIL/uL — AB (ref 3.87–5.11)
RDW: 15.3 % (ref 11.5–15.5)
WBC: 21.1 10*3/uL — AB (ref 4.0–10.5)

## 2013-01-21 LAB — MAGNESIUM: Magnesium: 1.8 mg/dL (ref 1.5–2.5)

## 2013-01-21 LAB — PHOSPHORUS: Phosphorus: 3.5 mg/dL (ref 2.3–4.6)

## 2013-01-21 MED ORDER — FREE WATER
200.0000 mL | Freq: Four times a day (QID) | Status: DC
  Administered 2013-01-21 – 2013-01-22 (×4): 200 mL

## 2013-01-21 MED ORDER — ARTIFICIAL TEARS OP OINT
1.0000 "application " | TOPICAL_OINTMENT | Freq: Three times a day (TID) | OPHTHALMIC | Status: DC
  Administered 2013-01-21 – 2013-01-24 (×10): 1 via OPHTHALMIC
  Filled 2013-01-21 (×2): qty 3.5

## 2013-01-21 MED ORDER — SODIUM CHLORIDE 0.9 % IV SOLN
3.0000 ug/kg/min | INTRAVENOUS | Status: DC
  Administered 2013-01-21 – 2013-01-22 (×2): 3 ug/kg/min via INTRAVENOUS
  Filled 2013-01-21 (×3): qty 20

## 2013-01-21 MED ORDER — CISATRACURIUM BOLUS VIA INFUSION
0.0500 mg/kg | Freq: Once | INTRAVENOUS | Status: AC
  Administered 2013-01-21: 3.4 mg via INTRAVENOUS
  Filled 2013-01-21: qty 4

## 2013-01-21 NOTE — Progress Notes (Signed)
CCM MD notified of pt's most recent ABG PH 7.27 PCO2 55.8 PO2 58 Bicarb 25.6 O2 Saturation 82%, notified of recent temp spike to 101.2 and of prn tylenol that was given also notified of pt becoming increasingly tachycardic into the 110-120's. No new orders were received. Pt currently on 100% FIO2 PEEP 16 RR 28 and TV 370. Will continue to monitor.

## 2013-01-21 NOTE — Progress Notes (Signed)
20mLs of expired Versed gtt wasted in sink wittnessed by Holland Fallingebecca Sarine, RN.  Carlos LeveringMcAtee, Brenon Antosh R , RN

## 2013-01-21 NOTE — Progress Notes (Signed)
Name: Briana Miranda MRN: [id number] DOB: 1927/06/25    ADMISSION DATE:  01/04/2013 CONSULTATION DATE:  01/14/2013  CHIEF COMPLAINT:  SOB  BRIEF PATIENT DESCRIPTION: 80 yrs old CF with hx of Arthritis and HTN , lives at home, sent to [place] from [facility] with severe PNA and septic shock  SIGNIFICANT EVENTS: 12/29 Transfer to [facility] 12/30 VDRF 01/01 Worsening hypoxia >> change to ARDS protocol  STUDIES:  TTE 1/3 >> LVEF 60-65%, diastolic dysfxn, mod to severe dilated RV  LINES / TUBES: LIJ 12/29 >> ETT 12/30 >>  CULTURES: bld 12/29 >> neg Urine 12/29 >> neg Legionella 12/29 >> neg Pneumococcal 12/29 >> neg Influenza pcr 12/29 >> neg resp 12/31 >>ng  ANTIBIOTICS: Vanc 12/29 >> Zosyn 12/29 >> Azithro 12/29 >>  SUBJECTIVE:  No significant changes > remains on high FiO2 and PEEP, on pressors.  Some increased tachycardia.  Family would like to meet for an update.   VITAL SIGNS: Temp:  [97.6 F (36.4 C)-101.2 F (38.4 C)] 98.3 F (36.8 C) (01/04 0800) Pulse Rate:  [81-116] 82 (01/04 1100) Resp:  [22-44] 25 (01/04 1100) BP: (94-156)/(36-62) 104/52 mmHg (01/04 1100) SpO2:  [89 %-100 %] 100 % (01/04 1100) FiO2 (%):  [100 %] 100 % (01/04 0845) Weight:  [68.9 kg (151 lb 14.4 oz)] 68.9 kg (151 lb 14.4 oz) (01/04 0352) HEMODYNAMICS: CVP:  [9 mmHg-19 mmHg] 10 mmHg VENTILATOR SETTINGS: Vent Mode:  [-] PRVC FiO2 (%):  [100 %] 100 % Set Rate:  [28 bmp] 28 bmp Vt Set:  [370 mL] 370 mL PEEP:  [5 cmH20-16 cmH20] 16 cmH20 Plateau Pressure:  [24 cmH20-35 cmH20] 35 cmH20 INTAKE / OUTPUT: Intake/Output     01/03 0701 - 01/04 0700 01/04 0701 - 01/05 0700   I.V. (mL/kg) 357.4 (5.2)    Other 60    NG/GT 1630 190   IV Piggyback 487.5    Total Intake(mL/kg) 2534.9 (36.8) 190 (2.8)   Urine (mL/kg/hr) 1345 (0.8) 175 (0.6)   Stool 2 (0)    Total Output 1347 175   Net +1187.9 +15          PHYSICAL EXAMINATION: General: ill appearing Neuro: unresponsive, grimace to pain HEENT: ETT  in place Cardiovascular:  irregular Lungs: b/l crackles, no wheeze Abdomen: soft, non tender Musculoskeletal:  1+ edema Skin:  No rash   LABS:  CBC  Recent Labs Lab 01/19/13 0425 01/20/13 0345 01/21/13 0330  WBC 27.6* 21.9* 21.1*  HGB 9.7* 9.8* 9.9*  HCT 30.7* 29.2* 32.5*  PLT 312 312 308   Coag's No results found for this basename: APTT, INR,  in the last 168 hours  BMET  Recent Labs Lab 01/19/13 0425 01/20/13 0345 01/21/13 0330  NA 147 150* 146  K 3.6* 3.5* 4.1  CL 113* 116* 110  CO2 23 24 27   BUN 37* 42* 41*  CREATININE 1.03 1.00 0.95  GLUCOSE 145* 146* 131*   Electrolytes  Recent Labs Lab 12/24/2012 0620  01/19/13 0425 01/20/13 0345 01/21/13 0330  CALCIUM 7.2*  < > 8.8 8.8 8.8  MG 2.3  --   --   --  1.8  PHOS 4.2  --   --   --  3.5  < > = values in this interval not displayed. Sepsis Markers  Recent Labs Lab 01/01/2013 0620  LATICACIDVEN 1.3  PROCALCITON 11.07   ABG  Recent Labs Lab 01/19/13 0309 01/20/13 0408 01/21/13 0330  PHART 7.291* 7.273* 7.277*  PCO2ART 46.3* 54.2* 55.8*  PO2ART 68.7* 85.4 58.0*   Liver Enzymes  Recent Labs Lab 05/01/12 0620  AST 45*  ALT 18  ALKPHOS 173*  BILITOT 0.5  ALBUMIN 1.9*   Cardiac Enzymes  Recent Labs Lab 05/01/12 0620 05/01/12 1100 05/01/12 1500  TROPONINI <0.30 <0.30 <0.30   Glucose  Recent Labs Lab 01/20/13 1156 01/20/13 1556 01/20/13 1955 01/21/13 01/21/13 0328 01/21/13 0730  GLUCAP 135* 136* 157* 114* 138* 163*    Imaging Dg Chest Port 1 View  01/20/2013   CLINICAL DATA:  ARDS.  EXAM: PORTABLE CHEST - 1 VIEW  COMPARISON:  01/19/2013  FINDINGS: Endotracheal tube is 2.7 cm above the carina. Left jugular central line catheter in the upper SVC region and unchanged. Diffuse patchy parenchymal lung densities are unchanged. Heart size is stable and within normal limits. Nasogastric tube extends into the abdomen. No evidence for a pneumothorax.  IMPRESSION: No change in the bilateral  parenchymal lung disease.  Stable support apparatuses.   Electronically Signed   By: Richarda OverlieAdam  Henn M.D.   On: 01/20/2013 08:42    ASSESSMENT / PLAN:  PULMONARY A:  Acute respiratory failure with ARDS likely 2nd to aspiration pneumonia. P:   -changed to ARDS protocol 1/01  -remains on high FiO2 and PEEP -f/u CXR, ABG  CARDIOVASCULAR A:  Septic shock 2nd to PNA, sedation requirements also contributing to hypotension TTE > LVEF 60-65%, diastolic dysfxn, moderate to severe RV dilation P:  -wean pressors for MAP goal > 60, SBP > 90   RENAL A: Acute kidney injury from shock and hypoxia - uncertain about baseline renal fx. Hyperkalemia - resolved. Hypokalemia. Hypernatremia Metabolic acidosis. P:   - free water added 1/3 -monitor renal fx, urine outpt, electrolytes  GASTROINTESTINAL A:  Nutrition. Diarrhea, C diff negative P: - place rectal pouch -tube feeds while on vent -protonix for SUP  HEMATOLOGIC A:  Leukocytosis  Anemia of critical illness. P:   -f/u CBC -transfuse for Hb < 7 -SQ heparin for DVT prevention  INFECTIOUS A:   Septic shock 2nd to pneumonia. Cx's negative P: -continue vancomycin, zosyn, zithromax > day 7 of 8 on 1/4   ENDOCRINE A:   No acute issues. P: -monitor CBG's while on tube feeds  NEUROLOGIC A:  Acute encephalopathy 2nd to sepsis, respiratory failure, AKI. P: -sedation protocol while on vent  Summary: ARDS ,expect prolonged ventilation & recovery Dr Vassie LollAlva D/w daughter Okey RegalCarol, continue current efforts over weekend & readdress goals of care by Monday if no progress. I will plan to speak again to family today if available.   The patient is critically ill with multiple organ systems failure and requires high complexity decision making for assessment and support, frequent evaluation and titration of therapies, application of advanced monitoring technologies and extensive interpretation of multiple databases. Critical Care Time devoted to  patient care services described in this note is 35 minutes.    Levy Pupaobert Rawlin Reaume, MD, PhD 01/21/2013, 11:06 AM [place] [facility] [phone] or if no answer [phone]

## 2013-01-21 NOTE — Progress Notes (Signed)
PULMONARY / CRITICAL CARE MEDICINE  Name: Briana Miranda MRN: [id number] DOB: Apr 22, 1927    ADMISSION DATE:  12/31/2012 CONSULTATION DATE:  12/31/2012  CHIEF COMPLAINT:  Dyspnea  BRIEF PATIENT DESCRIPTION: 80 yo transferred to [facility] with severe pneumonia, septic shock and ARDS.  SIGNIFICANT EVENTS / STUDIES: 12/29  [facility] LVEF 60-65%, diastolic dysfunction, mod to severe dilated RV  LINES / TUBES: L IJ TLC 12/29 >>> OETT 12/30 >>> OGT 12/30 >>> Foley 12/29 >>>  CULTURES: 12/29  MRSA PCR >>> neg 12/29  Blood >>> neg 12/29  Urine >>> neg 12/29  Legionella Ag >>> neg 12/29  Pneumococcal Ag >>> neg 12/29  Influenza PCR  >>> neg 12/31  Respiratory >>> neg  ANTIBIOTICS: Vancomycin 12/29 >>> 1/5 Zosyn 12/29 >>> Azithromycin 12/29 >>> ???  INTERVAL HISTORY: No events overnight.  VITAL SIGNS: Temp:  [97.8 F (36.6 C)-101.2 F (38.4 C)] 98.9 F (37.2 C) (01/04 1545) Pulse Rate:  [80-116] 87 (01/04 1545) Resp:  [18-35] 21 (01/04 1545) BP: (92-156)/(36-66) 109/53 mmHg (01/04 1545) SpO2:  [90 %-100 %] 100 % (01/04 1545) FiO2 (%):  [100 %] 100 % (01/04 1150) Weight:  [68.9 kg (151 lb 14.4 oz)] 68.9 kg (151 lb 14.4 oz) (01/04 0352)  HEMODYNAMICS: CVP:  [9 mmHg-10 mmHg] 10 mmHg  VENTILATOR SETTINGS: Vent Mode:  [-] PRVC FiO2 (%):  [100 %] 100 % Set Rate:  [28 bmp] 28 bmp Vt Set:  [370 mL] 370 mL PEEP:  [16 cmH20] 16 cmH20 Plateau Pressure:  [35 cmH20] 35 cmH20  INTAKE / OUTPUT: Intake/Output     01/03 0701 - 01/04 0700 01/04 0701 - 01/05 0700   I.V. (mL/kg) 357.4 (5.2) 132.2 (1.9)   Other 60    NG/GT 1630 890   IV Piggyback 487.5 50   Total Intake(mL/kg) 2534.9 (36.8) 1072.2 (15.6)   Urine (mL/kg/hr) 1345 (0.8) 450 (0.7)   Stool 2 (0)    Total Output 1347 450   Net +1187.9 +622.2         PHYSICAL EXAMINATION: General: Mechanically ventilated Neuro: Sedated, paralized HEENT: ETT/OGT Cardiovascular:  Regular, tachycardic Lungs: Bilateral air  entry, rales Abdomen: soft, bowel sounds diminished Musculoskeletal:  Trace edema, cold / blue LE Skin:  No rash   LABS:  CBC  Recent Labs Lab 01/19/13 0425 01/20/13 0345 01/21/13 0330  WBC 27.6* 21.9* 21.1*  HGB 9.7* 9.8* 9.9*  HCT 30.7* 29.2* 32.5*  PLT 312 312 308   Coag's No results found for this basename: APTT, INR,  in the last 168 hours  BMET  Recent Labs Lab 01/19/13 0425 01/20/13 0345 01/21/13 0330  NA 147 150* 146  K 3.6* 3.5* 4.1  CL 113* 116* 110  CO2 23 24 27   BUN 37* 42* 41*  CREATININE 1.03 1.00 0.95  GLUCOSE 145* 146* 131*   Electrolytes  Recent Labs Lab 12/18/2012 0620  01/19/13 0425 01/20/13 0345 01/21/13 0330  CALCIUM 7.2*  < > 8.8 8.8 8.8  MG 2.3  --   --   --  1.8  PHOS 4.2  --   --   --  3.5  < > = values in this interval not displayed. Sepsis Markers  Recent Labs Lab 01/12/2013 0620  LATICACIDVEN 1.3  PROCALCITON 11.07   ABG  Recent Labs Lab 01/19/13 0309 01/20/13 0408 01/21/13 0330  PHART 7.291* 7.273* 7.277*  PCO2ART 46.3* 54.2* 55.8*  PO2ART 68.7* 85.4 58.0*   Liver Enzymes  Recent Labs Lab 12/19/2012 [phone]  AST 45*  ALT 18  ALKPHOS 173*  BILITOT 0.5  ALBUMIN 1.9*   Cardiac Enzymes  Recent Labs Lab 12/20/2012 0620 01/11/2013 1100 01/13/2013 1500  TROPONINI <0.30 <0.30 <0.30   Glucose  Recent Labs Lab 01/20/13 1955 01/21/13 01/21/13 0328 01/21/13 0730 01/21/13 1116 01/21/13 1517  GLUCAP 157* 114* 138* 163* 121* 113*   CXR: 1/5 >>> Hardware in good position, bilateral airspace disease R>L  ASSESSMENT / PLAN:  PULMONARY A:  Acute respiratory failure. Aspiration pneumonia. ARDS. P:   Goal pH>7.30, SpO2>92, PLP<30 Continuous mechanical support, ARDS Net 6 cc/kg Permissive hypercapnea VAP bundle Hold SBT while on Nimbex Trend ABG/CXR Albuterol / Atrovent PRN  CARDIOVASCULAR A:  Septic shock secondary to pneumonia. High vent settings likely contributing to hypotension. P:  Goal  MAP>60 Levophed gtt  RENAL A: AKI - resolved. Hyperkalemia - resolved. Hypokalemia - resolved. Metabolic acidosis - resolved. Hypernatremia Positive fluid balance: stay pos 13L, CVP 16 P:   Trend BMP D/c free water Lasix 40 q6h x 4 doses  GASTROINTESTINAL A:  Nutrition. Diarrhea, C diff negative. GI Px. P: TF Protonix  HEMATOLOGIC A:  Leukocytosis.  Anemia of critical illness. DVT Px. P:  Trend CBC Heparin [place]  INFECTIOUS A:   Presumed aspiration pneumonia, cx negative. P: Abx / culture as above D/c Vancomycin  ENDOCRINE A:   No acute issues. P: SSI  NEUROLOGIC A:  Acute encephalopathy. P: Fentanyl / Versed / Nimbex Hold WUA while on Nimbex  I have personally obtained history, examined patient, evaluated and interpreted laboratory and imaging results, reviewed medical records, formulated assessment / plan and placed orders.  CRITICAL CARE:  The patient is critically ill with multiple organ systems failure and requires high complexity decision making for assessment and support, frequent evaluation and titration of therapies, application of advanced monitoring technologies and extensive interpretation of multiple databases. Critical Care Time devoted to patient care services described in this note is 35 minutes.   Lonia Farber, MD Pulmonary and Critical Care Medicine [facility] Pager: [phone]  01/21/2013, 4:46 PM

## 2013-01-21 NOTE — Progress Notes (Signed)
Spoke with daughter Okey RegalCarol about her request for a family meeting today at noon.  MD Byrum stated that if he is not available in house he will call family about pt status and update and plan for a family sit down this week.  Will continue to monitor and update.

## 2013-01-21 NOTE — Progress Notes (Signed)
[facility]updated and requested to confirm with CCM MD that no new orders are needed with concerns for pt current status. No new orders received at this time. Will continue to monitor for pt changes. Safety maintained.

## 2013-01-21 NOTE — Progress Notes (Signed)
PCCM Interval Note  Met with family at bedside regarding status, prognosis and plans. Reviewed ARDS, it's causes and treatment. They had good questions about potential interventions like paralysis, proning, HFOV - I explained the pro's and con's of each of these. They understand that we are largely in watch/wait situation right now. I have asked them to continue to get updates to detail whether pt requiring less or more support. I have also asked them to be prepared to discuss this week whether we should continue to support on MV. They would like to meet Wed 01/24/13 if possible. We will try to arrange. We also discussed code status in the event of an acute decompensation. I have advocated for full medical care, vent, pressors, etc, but that she should not go through ACLS. They agree. I will reflect this in the orders.   Additional CC time 45 minutes.   Baltazar Apo, MD, PhD 01/21/2013, 1:22 PM [place] [facility] [phone] or if no answer [phone]

## 2013-01-22 ENCOUNTER — Inpatient Hospital Stay (HOSPITAL_COMMUNITY): Payer: Medicare Other

## 2013-01-22 DIAGNOSIS — J690 Pneumonitis due to inhalation of food and vomit: Secondary | ICD-10-CM

## 2013-01-22 LAB — GLUCOSE, CAPILLARY
GLUCOSE-CAPILLARY: 140 mg/dL — AB (ref 70–99)
GLUCOSE-CAPILLARY: 109 mg/dL — AB (ref 70–99)
GLUCOSE-CAPILLARY: 157 mg/dL — AB (ref 70–99)
Glucose-Capillary: 123 mg/dL — ABNORMAL HIGH (ref 70–99)
Glucose-Capillary: 129 mg/dL — ABNORMAL HIGH (ref 70–99)
Glucose-Capillary: 133 mg/dL — ABNORMAL HIGH (ref 70–99)

## 2013-01-22 LAB — BASIC METABOLIC PANEL
BUN: 42 mg/dL — ABNORMAL HIGH (ref 6–23)
CO2: 28 meq/L (ref 19–32)
Calcium: 9.2 mg/dL (ref 8.4–10.5)
Chloride: 110 mEq/L (ref 96–112)
Creatinine, Ser: 0.94 mg/dL (ref 0.50–1.10)
GFR calc Af Amer: 63 mL/min — ABNORMAL LOW (ref >90)
GFR, EST NON AFRICAN AMERICAN: 55 mL/min — AB (ref >90)
Glucose, Bld: 144 mg/dL — ABNORMAL HIGH (ref 70–99)
Potassium: 4.6 mEq/L (ref 3.7–5.3)
SODIUM: 147 meq/L (ref 137–147)

## 2013-01-22 LAB — MAGNESIUM: MAGNESIUM: 2 mg/dL (ref 1.5–2.5)

## 2013-01-22 LAB — CBC
HCT: 33.7 % — ABNORMAL LOW (ref 36.0–46.0)
Hemoglobin: 10.1 g/dL — ABNORMAL LOW (ref 12.0–15.0)
MCH: 31.3 pg (ref 26.0–34.0)
MCHC: 30 g/dL (ref 30.0–36.0)
MCV: 104.3 fL — ABNORMAL HIGH (ref 78.0–100.0)
PLATELETS: 318 10*3/uL (ref 150–400)
RBC: 3.23 MIL/uL — ABNORMAL LOW (ref 3.87–5.11)
RDW: 15.6 % — AB (ref 11.5–15.5)
WBC: 22.1 10*3/uL — ABNORMAL HIGH (ref 4.0–10.5)

## 2013-01-22 LAB — PHOSPHORUS: PHOSPHORUS: 4.8 mg/dL — AB (ref 2.3–4.6)

## 2013-01-22 MED ORDER — ALBUTEROL SULFATE (2.5 MG/3ML) 0.083% IN NEBU
2.5000 mg | INHALATION_SOLUTION | Freq: Four times a day (QID) | RESPIRATORY_TRACT | Status: DC
  Administered 2013-01-22 – 2013-01-23 (×5): 2.5 mg via RESPIRATORY_TRACT
  Filled 2013-01-22 (×5): qty 3

## 2013-01-22 MED ORDER — OXEPA PO LIQD
1000.0000 mL | ORAL | Status: DC
  Administered 2013-01-23 – 2013-01-24 (×2): 1000 mL
  Filled 2013-01-22 (×6): qty 1000

## 2013-01-22 MED ORDER — ACETYLCYSTEINE 10 % IN SOLN
4.0000 mL | Freq: Once | RESPIRATORY_TRACT | Status: AC
  Administered 2013-01-22: 4 mL via RESPIRATORY_TRACT
  Filled 2013-01-22: qty 4

## 2013-01-22 MED ORDER — ACETYLCYSTEINE 20 % IN SOLN
3.0000 mL | Freq: Four times a day (QID) | RESPIRATORY_TRACT | Status: DC
  Administered 2013-01-22 – 2013-01-24 (×7): 3 mL via RESPIRATORY_TRACT
  Administered 2013-01-24: 15:00:00 via RESPIRATORY_TRACT
  Filled 2013-01-22 (×11): qty 4

## 2013-01-22 MED ORDER — FUROSEMIDE 10 MG/ML IJ SOLN
40.0000 mg | Freq: Four times a day (QID) | INTRAMUSCULAR | Status: AC
  Administered 2013-01-22 – 2013-01-23 (×4): 40 mg via INTRAVENOUS
  Filled 2013-01-22 (×4): qty 4

## 2013-01-22 NOTE — Progress Notes (Signed)
Assisted Dr. Janett BillowZubelviitskiy with bronchoscopy. Pt placed on 100% prior to & throughout the procedure. Pt remained stable throughout. Will continue to monitor pt progress.

## 2013-01-22 NOTE — Progress Notes (Signed)
Increases nimbex to , pt resp rate per vent were upper 40s. Prior to increasing train of four had 2 twitches. Will cont to monitor and assess

## 2013-01-22 NOTE — Progress Notes (Signed)
NUTRITION FOLLOW UP  Intervention:    Decrease Oxepa formula to goal rate of 35 ml/hr Continue Prostat liquid protein 30 ml TID via tube Total EN regimen to provide 1560 kcals, 98 gm protein, 659 ml of free water  RD to follow for nutrition care plan  Nutrition Dx:   Inadequate oral intake related to inability to eat as evidenced by NPO status, ongoing  Goal:   EN to meet > 90% of estimated nutrition needs, met  Monitor:   EN regimen & tolerance, respiratory status, weight, labs, I/O's  Assessment:   Patient with PMH of arthritis and HTN , lives at home, presented with severe PNA and septic shock.   Patient is currently intubated on ventilator support MV: 10.6 L/min Temp (24hrs), Avg:98.1 F (36.7 C), Min:97.4 F (36.3 C), Max:99.1 F (37.3 C)   Oxepa formula infusing at goal rate of 40 ml/hr via OGT with Prostat liquid protein 30 ml 3 times daily providing 1740 kcals, 105 gm protein, 754 ml of water.  Liquid MVI daily via tube.  ARDS Protocol in place.  + Nimbex.  Family meeting this week.  Height: Ht Readings from Last 1 Encounters:  12/23/2012 5' 3"  (1.6 m)    Weight Status:   Wt Readings from Last 1 Encounters:  01/22/13 166 lb 14.2 oz (75.7 kg)    Re-estimated needs:  Kcal: 1500-1650 Protein: 95-105 gm Fluid: 1.5-1.6 L  Skin: Intact  Diet Order: NPO   Intake/Output Summary (Last 24 hours) at 01/22/13 1152 Last data filed at 01/22/13 1100  Gross per 24 hour  Intake 3181.25 ml  Output   1065 ml  Net 2116.25 ml    Labs:   Recent Labs Lab 01/20/13 0345 01/21/13 0330 01/22/13 0415  NA 150* 146 147  K 3.5* 4.1 4.6  CL 116* 110 110  CO2 24 27 28   BUN 42* 41* 42*  CREATININE 1.00 0.95 0.94  CALCIUM 8.8 8.8 9.2  MG  --  1.8 2.0  PHOS  --  3.5 4.8*  GLUCOSE 146* 131* 144*    CBG (last 3)   Recent Labs  01/22/13 01/22/13 0321 01/22/13 0722  GLUCAP 133* 129* 140*    Scheduled Meds: . antiseptic oral rinse  1 application Mouth Rinse QID   . artificial tears  1 application Both Eyes M4B  . chlorhexidine  15 mL Mouth/Throat BID  . feeding supplement (OXEPA)  1,000 mL Per Tube Q24H  . feeding supplement (PRO-STAT SUGAR FREE 64)  30 mL Per Tube TID  . furosemide  40 mg Intravenous Q6H  . heparin  5,000 Units Subcutaneous Q8H  . insulin aspart  2-6 Units Subcutaneous Q4H  . multivitamin  5 mL Per Tube Daily  . pantoprazole sodium  40 mg Per Tube Q1200  . piperacillin-tazobactam (ZOSYN)  IV  3.375 g Intravenous Q8H    Continuous Infusions: . cisatracurium (NIMBEX) infusion 3 mcg/kg/min (01/22/13 1100)  . fentaNYL infusion INTRAVENOUS 150 mcg/hr (01/22/13 1100)  . midazolam (VERSED) infusion 1 mg/hr (01/22/13 1100)  . norepinephrine (LEVOPHED) Adult infusion 3 mcg/min (01/22/13 1100)    Arthur Holms, RD, LDN Pager #: [phone] After-Hours Pager #: [phone]

## 2013-01-22 NOTE — Progress Notes (Signed)
ANTIBIOTIC CONSULT NOTE - FOLLOW UP  Pharmacy Consult for Zosyn Indication: pneumonia and sepsis  No Known Allergies  Patient Measurements: Height: 5\' 3"  (160 cm) Weight: 166 lb 14.2 oz (75.7 kg) IBW/kg (Calculated) : 52.4  Vital Signs: Temp: 97.7 F (36.5 C) (01/05 0723) Temp src: Oral (01/05 0723) BP: 162/56 mmHg (01/05 1015) Pulse Rate: 103 (01/05 1015) Intake/Output from previous day: 01/04 0701 - 01/05 0700 In: 3132.9 [I.V.:765.4; NG/GT:1890; IV Piggyback:437.5] Out: 1150 [Urine:1150] Intake/Output from this shift: Total I/O In: 505.4 [I.V.:142.9; NG/GT:350; IV Piggyback:12.5] Out: 90 [Urine:90]  Labs:  Recent Labs  01/20/13 0345 01/21/13 0330 01/22/13 0415  WBC 21.9* 21.1* 22.1*  HGB 9.8* 9.9* 10.1*  PLT 312 308 318  CREATININE 1.00 0.95 0.94   Estimated Creatinine Clearance: 44.2 ml/min (by C-G formula based on Cr of 0.94).  Recent Labs  01/20/13 0345  VANCOTROUGH 21.7*    Assessment: 85yof continues on day#8 zosyn for pneumonia/sepsis. Renal function is stable.  Vanc 12/29 >>1/5 Zosyn 12/29 >> Azithro 12/29 >>1/2  12/29 BCx >> neg 12/29 UCx >> neg 12/29 Flu >> neg 12/31 Trach asp - neg 1/3 cdiff -neg  Goal of Therapy:  Appropriate zosyn dosing  Plan:  1) Continue zosyn 3.375g IV q8 (4 hour infusion) 2) Continue to follow renal function  Fredrik RiggerMarkle, Briana Miranda 01/22/2013,10:46 AM

## 2013-01-22 NOTE — Procedures (Signed)
Name:  Burna Cashunice Knopf MRN:  [id number] DOB:  Apr 23, 1927  PROCEDURE NOTE  Procedure:   Flexible bronchoscopy ([id number])  Indications:  Increased airway pressures / inability to bass suction catheter, suspicion for mucus plug.  Consent:  Consent obtained from medical POA.  Anesthesia:  Already sedated for mechanical ventilation.   Procedure summary:  Appropriate equipment was assembled.  The patient was identified as Burna CashEunice Codrington.  Safety timeout was performed. The patient was placed supine and adequate level of sedation was assured.  Flexible bronchoscope was lubricated and inserted via the endotracheal tube.  Dried secretions were noted inside the endotracheal tube with near complete obstruction of the lumen. Mucomyst was used via bronchoscope and secretions were aspirated.  Carina was noted to have minor mucosal trauma, likely from repeated suctioning.  Airways appeared normal without overt endobronchial lesions.  Specimens sent:  None   Complications:  No immediate complications were noted.  Hemodynamic parameters and oxygenation remained stable throughout the procedure.  Estimated blood loss:  Less then 5 mL.  Orlean BradfordK. Ladoris Lythgoe, M.D. Pulmonary and Critical Care Medicine [facility] Pager: [phone]  01/22/2013, 6:14 PM

## 2013-01-22 NOTE — Progress Notes (Signed)
Increased fent to , pt was double stacking on the vent. Bis number reading lower 40s to upper 30s. Will cont to monitor and assess patient

## 2013-01-23 ENCOUNTER — Inpatient Hospital Stay (HOSPITAL_COMMUNITY): Payer: Medicare Other

## 2013-01-23 LAB — BLOOD GAS, ARTERIAL
ACID-BASE EXCESS: 3.2 mmol/L — AB (ref 0.0–2.0)
Bicarbonate: 29.5 mEq/L — ABNORMAL HIGH (ref 20.0–24.0)
DRAWN BY: 312761
FIO2: 0.8 %
LHR: 28 {breaths}/min
MECHVT: 370 mL
O2 Saturation: 98 %
PCO2 ART: 66.2 mmHg — AB (ref 35.0–45.0)
PEEP: 14 cmH2O
PO2 ART: 111 mmHg — AB (ref 80.0–100.0)
Patient temperature: 98.6
TCO2: 31.5 mmol/L (ref 0–100)
pH, Arterial: 7.271 — ABNORMAL LOW (ref 7.350–7.450)

## 2013-01-23 LAB — BASIC METABOLIC PANEL
BUN: 52 mg/dL — ABNORMAL HIGH (ref 6–23)
CALCIUM: 9.3 mg/dL (ref 8.4–10.5)
CO2: 31 meq/L (ref 19–32)
CREATININE: 1.18 mg/dL — AB (ref 0.50–1.10)
Chloride: 105 mEq/L (ref 96–112)
GFR calc Af Amer: 48 mL/min — ABNORMAL LOW (ref >90)
GFR calc non Af Amer: 41 mL/min — ABNORMAL LOW (ref >90)
Glucose, Bld: 118 mg/dL — ABNORMAL HIGH (ref 70–99)
Potassium: 3.8 mEq/L (ref 3.7–5.3)
Sodium: 146 mEq/L (ref 137–147)

## 2013-01-23 LAB — GLUCOSE, CAPILLARY
Glucose-Capillary: 117 mg/dL — ABNORMAL HIGH (ref 70–99)
Glucose-Capillary: 121 mg/dL — ABNORMAL HIGH (ref 70–99)
Glucose-Capillary: 135 mg/dL — ABNORMAL HIGH (ref 70–99)
Glucose-Capillary: 147 mg/dL — ABNORMAL HIGH (ref 70–99)
Glucose-Capillary: 152 mg/dL — ABNORMAL HIGH (ref 70–99)
Glucose-Capillary: 155 mg/dL — ABNORMAL HIGH (ref 70–99)

## 2013-01-23 LAB — CBC
HCT: 34.1 % — ABNORMAL LOW (ref 36.0–46.0)
Hemoglobin: 10.4 g/dL — ABNORMAL LOW (ref 12.0–15.0)
MCH: 31.8 pg (ref 26.0–34.0)
MCHC: 30.5 g/dL (ref 30.0–36.0)
MCV: 104.3 fL — AB (ref 78.0–100.0)
PLATELETS: 366 10*3/uL (ref 150–400)
RBC: 3.27 MIL/uL — AB (ref 3.87–5.11)
RDW: 15.6 % — AB (ref 11.5–15.5)
WBC: 19.8 10*3/uL — ABNORMAL HIGH (ref 4.0–10.5)

## 2013-01-23 MED ORDER — FUROSEMIDE 10 MG/ML IJ SOLN
40.0000 mg | Freq: Four times a day (QID) | INTRAMUSCULAR | Status: AC
  Administered 2013-01-23 – 2013-01-24 (×4): 40 mg via INTRAVENOUS
  Filled 2013-01-23 (×4): qty 4

## 2013-01-23 MED ORDER — IPRATROPIUM BROMIDE 0.02 % IN SOLN
0.5000 mg | Freq: Four times a day (QID) | RESPIRATORY_TRACT | Status: DC
  Administered 2013-01-23: 0.5 mg via RESPIRATORY_TRACT
  Filled 2013-01-23: qty 2.5

## 2013-01-23 NOTE — Progress Notes (Signed)
CRITICAL VALUE ALERT  Critical value received:  0400  Date of notification:  01/23/2013  Time of notification:  0400  Critical value read back:yes  Nurse who received alert:  Okey DupreJennifer Parrish  Notified (1st page):  Vinnie LangtonGretchen, CCM  Time of call:  0405

## 2013-01-23 NOTE — Progress Notes (Signed)
PULMONARY / CRITICAL CARE MEDICINE  Name: Briana Miranda MRN: [id number] DOB: 1927-10-04    ADMISSION DATE:  01/06/2013 CONSULTATION DATE:  12/24/2012  CHIEF COMPLAINT:  Dyspnea  BRIEF PATIENT DESCRIPTION: 80 yo transferred to CM with severe pneumonia, septic shock and ARDS.  SIGNIFICANT EVENTS / STUDIES: 12/29  [facility] L[facility]F 60-65%, diastolic dysfunction, mod to severe dilated RV 1/5       Bronchoscopy >>> ETT obstruction with dried secretions  LINES / TUBES: L IJ TLC 12/29 >>> OETT 12/30 >>> OGT 12/30 >>> Foley 12/29 >>>  CULTURES: 12/29  MRSA PCR >>> neg 12/29  Blood >>> neg 12/29  Urine >>> neg 12/29  Legionella Ag >>> neg 12/29  Pneumococcal Ag >>> neg 12/29  Influenza PCR  >>> neg 12/31  Respiratory >>> neg  ANTIBIOTICS: Vancomycin 12/29 >>> 1/5 Zosyn 12/29 >>> Azithromycin 12/29 >>> ???  INTERVAL HISTORY: Airway pressures down after bronchoscopy. Oxygen requirements decreased.  VITAL SIGNS: Temp:  [97.7 F (36.5 C)-98.5 F (36.9 C)] 98.1 F (36.7 C) (01/06 0722) Pulse Rate:  [87-106] 103 (01/06 0800) Resp:  [18-28] 28 (01/06 0800) BP: (80-162)/(36-62) 109/49 mmHg (01/06 0800) SpO2:  [97 %-100 %] 100 % (01/06 0800) FiO2 (%):  [70 %-100 %] 70 % (01/06 0735) Weight:  [74.4 kg (164 lb 0.4 oz)] 74.4 kg (164 lb 0.4 oz) (01/06 0515)  HEMODYNAMICS: CVP:  [14 mmHg-16 mmHg] 14 mmHg  VENTILATOR SETTINGS: Vent Mode:  [-] PRVC FiO2 (%):  [70 %-100 %] 70 % Set Rate:  [28 bmp] 28 bmp Vt Set:  [370 mL] 370 mL PEEP:  [5 cmH20-16 cmH20] 14 cmH20 Plateau Pressure:  [25 cmH20-33 cmH20] 25 cmH20  INTAKE / OUTPUT: Intake/Output     01/05 0701 - 01/06 0700 01/06 0701 - 01/07 0700   I.V. (mL/kg) 1313.4 (17.7)    Other     NG/GT 1150    IV Piggyback 162.5    Total Intake(mL/kg) 2625.9 (35.3)    Urine (mL/kg/hr) 3525 (2)    Total Output 3525     Net -899.2          Stool Occurrence 1 x     PHYSICAL EXAMINATION: General: Mechanically  ventilated Neuro: Sedated, paralized HEENT: ETT/OGT Cardiovascular:  Regular, tachycardic Lungs: Rales bilaterally Abdomen: Soft, bowel sounds diminished Musculoskeletal:  Trace edema, cold / blue LE Skin:  No rash   LABS:  CBC  Recent Labs Lab 01/21/13 0330 01/22/13 0415 01/23/13 0415  WBC 21.1* 22.1* 19.8*  HGB 9.9* 10.1* 10.4*  HCT 32.5* 33.7* 34.1*  PLT 308 318 366   Coag's No results found for this basename: APTT, INR,  in the last 168 hours  BMET  Recent Labs Lab 01/21/13 0330 01/22/13 0415 01/23/13 0415  NA 146 147 146  K 4.1 4.6 3.8  CL 110 110 105  CO2 27 28 31   BUN 41* 42* 52*  CREATININE 0.95 0.94 1.18*  GLUCOSE 131* 144* 118*   Electrolytes  Recent Labs Lab 01/21/13 0330 01/22/13 0415 01/23/13 0415  CALCIUM 8.8 9.2 9.3  MG 1.8 2.0  --   PHOS 3.5 4.8*  --    Sepsis Markers No results found for this basename: LATICACIDVEN, PROCALCITON, O2SATVEN,  in the last 168 hours ABG  Recent Labs Lab 01/20/13 0408 01/21/13 0330 01/23/13 0332  PHART 7.273* 7.277* 7.271*  PCO2ART 54.2* 55.8* 66.2*  PO2ART 85.4 58.0* 111.0*   Liver Enzymes No results found for this basename: AST, ALT, ALKPHOS, BILITOT, ALBUMIN,  in the last 168 hours Cardiac Enzymes No results found for this basename: TROPONINI, PROBNP,  in the last 168 hours Glucose  Recent Labs Lab 01/22/13 1109 01/22/13 1528 01/22/13 1945 01/22/13 2339 01/23/13 0346 01/23/13 0720  GLUCAP 109* 123* 157* 155* 117* 121*   CXR: 1/6 >>> Hardware in good position, bilateral  ASSESSMENT / PLAN:  PULMONARY A:  Acute respiratory failure. Aspiration pneumonia. ARDS. P:   Goal pH>7.30, SpO2>92, PLP<30 Continuous mechanical support, ARDS Net 6 cc/kg Titrate FiO2 down to 50 as tolerated, keep PEEP 14 Permissive hypercapnea VAP bundle Hold SBT Trend ABG/CXR Albuterol / Atrovent PRN Mucomyst  CARDIOVASCULAR A:  Septic shock secondary to pneumonia. High vent settings likely  contributing to hypotension. P:  Goal MAP>60 Levophed gtt  RENAL A: AKI - resolved. Hyperkalemia - resolved. Hypokalemia - resolved. Metabolic acidosis - resolved. Hypernatremia Positive fluid balance: stay pos 13L --> 12L, CVP 15 P:   Trend BMP Repeat Lasix 40 q6h x 4 doses  GASTROINTESTINAL A:  Nutrition. Diarrhea, C diff negative. GI Px. P: TF Protonix  HEMATOLOGIC A:  Leukocytosis.  Anemia of critical illness. DVT Px. P:  Trend CBC Heparin [place]  INFECTIOUS A:   Presumed aspiration pneumonia, cx negative. P: Abx / culture as above  ENDOCRINE A:   No acute issues. P: SSI  NEUROLOGIC A:  Acute encephalopathy. P: Fentanyl / Versed D/c Nimbex Hold WUA  Goals of care meeting 1/9.  I have personally obtained history, examined patient, evaluated and interpreted laboratory and imaging results, reviewed medical records, formulated assessment / plan and placed orders.  CRITICAL CARE:  The patient is critically ill with multiple organ systems failure and requires high complexity decision making for assessment and support, frequent evaluation and titration of therapies, application of advanced monitoring technologies and extensive interpretation of multiple databases. Critical Care Time devoted to patient care services described in this note is 35 minutes.   Lonia Farber, MD Pulmonary and Critical Care Medicine [facility] Pager: [phone]  01/23/2013, 8:57 AM

## 2013-01-24 ENCOUNTER — Inpatient Hospital Stay (HOSPITAL_COMMUNITY): Payer: Medicare Other

## 2013-01-24 LAB — GLUCOSE, CAPILLARY
GLUCOSE-CAPILLARY: 143 mg/dL — AB (ref 70–99)
GLUCOSE-CAPILLARY: 117 mg/dL — AB (ref 70–99)
GLUCOSE-CAPILLARY: 158 mg/dL — AB (ref 70–99)
Glucose-Capillary: 109 mg/dL — ABNORMAL HIGH (ref 70–99)
Glucose-Capillary: 139 mg/dL — ABNORMAL HIGH (ref 70–99)
Glucose-Capillary: 137 mg/dL — ABNORMAL HIGH (ref 70–99)

## 2013-01-24 LAB — CBC
HCT: 30 % — ABNORMAL LOW (ref 36.0–46.0)
Hemoglobin: 9.3 g/dL — ABNORMAL LOW (ref 12.0–15.0)
MCH: 31.8 pg (ref 26.0–34.0)
MCHC: 31 g/dL (ref 30.0–36.0)
MCV: 102.7 fL — AB (ref 78.0–100.0)
Platelets: 372 10*3/uL (ref 150–400)
RBC: 2.92 MIL/uL — ABNORMAL LOW (ref 3.87–5.11)
RDW: 15.4 % (ref 11.5–15.5)
WBC: 16.1 10*3/uL — AB (ref 4.0–10.5)

## 2013-01-24 LAB — BASIC METABOLIC PANEL
BUN: 63 mg/dL — AB (ref 6–23)
CO2: 36 meq/L — AB (ref 19–32)
Calcium: 9 mg/dL (ref 8.4–10.5)
Chloride: 105 mEq/L (ref 96–112)
Creatinine, Ser: 1.37 mg/dL — ABNORMAL HIGH (ref 0.50–1.10)
GFR calc non Af Amer: 35 mL/min — ABNORMAL LOW (ref >90)
GFR, EST AFRICAN AMERICAN: 40 mL/min — AB (ref >90)
Glucose, Bld: 111 mg/dL — ABNORMAL HIGH (ref 70–99)
Potassium: 3.4 mEq/L — ABNORMAL LOW (ref 3.7–5.3)
Sodium: 150 mEq/L — ABNORMAL HIGH (ref 137–147)

## 2013-01-24 MED ORDER — IPRATROPIUM-ALBUTEROL 0.5-2.5 (3) MG/3ML IN SOLN
3.0000 mL | Freq: Four times a day (QID) | RESPIRATORY_TRACT | Status: DC
  Administered 2013-01-24 – 2013-01-27 (×14): 3 mL via RESPIRATORY_TRACT
  Filled 2013-01-24 (×41): qty 3

## 2013-01-24 NOTE — Progress Notes (Signed)
PULMONARY / CRITICAL CARE MEDICINE  Name: Briana Miranda MRN: [id number] DOB: [birth year]    ADMISSION DATE:  18-Jan-2013 CONSULTATION DATE:  January 18, 2013  CHIEF COMPLAINT:  Dyspnea  BRIEF PATIENT DESCRIPTION: 80 yo transferred to CM with severe pneumonia, septic shock and ARDS.  SIGNIFICANT EVENTS / STUDIES: 12/29  Transfer to [facility] 1/3 LVEF 60-65%, diastolic dysfunction, mod to severe dilated RV 1/5       Bronchoscopy >>> ETT obstruction with dried secretions  LINES / TUBES: L IJ TLC 12/29 >>> OETT 12/30 >>> OGT 12/30 >>> Foley 12/29 >>>  CULTURES: 12/29  MRSA PCR >>> neg 12/29  Blood >>> neg 12/29  Urine >>> neg 12/29  Legionella Ag >>> neg 12/29  Pneumococcal Ag >>> neg 12/29  Influenza PCR  >>> neg 12/31  Respiratory >>> neg  ANTIBIOTICS: Vancomycin 12/29 >>> 1/5 Zosyn 12/29 >>> Azithromycin 12/29 >>> ???  INTERVAL HISTORY:   01/24/13: On levophed 7mcg/ Fio2 50%, peep 10. Improved pressors needed but similar oxygen need. WEll sedated. Off nimbex x 24h  VITAL SIGNS: Temp:  [98.2 F (36.8 C)-98.4 F (36.9 C)] 98.4 F (36.9 C) (01/07 0823) Pulse Rate:  [94-107] 96 (01/07 1000) Resp:  [13-28] 22 (01/07 1000) BP: (86-132)/(37-54) 124/44 mmHg (01/07 1000) SpO2:  [94 %-100 %] 96 % (01/07 1000) FiO2 (%):  [50 %-70 %] 50 % (01/07 0745) Weight:  [70.3 kg (154 lb 15.7 oz)] 70.3 kg (154 lb 15.7 oz) (01/07 0500)  HEMODYNAMICS: CVP:  [9 mmHg-35 mmHg] 10 mmHg  VENTILATOR SETTINGS: Vent Mode:  [-] PRVC FiO2 (%):  [50 %-70 %] 50 % Set Rate:  [24 bmp-28 bmp] 24 bmp Vt Set:  [370 mL] 370 mL PEEP:  [10 cmH20] 10 cmH20 Plateau Pressure:  [20 cmH20-26 cmH20] 23 cmH20  INTAKE / OUTPUT: Intake/Output     01/06 0701 - 01/07 0700 01/07 0701 - 01/08 0700   I.V. (mL/kg) 848 (12.1) 114.7 (1.6)   NG/GT 1105 105   IV Piggyback 150    Total Intake(mL/kg) 2103 (29.9) 219.7 (3.1)   Urine (mL/kg/hr) 4005 (2.4) 700 (2.1)   Total Output 4005 700   Net -1902 -480.3        Stool  Occurrence 1 x     PHYSICAL EXAMINATION: General: Mechanically ventilated Neuro: Sedated, RASS -4 not moving limbs, no gag per RN. Only flutters eye lids HEENT: ETT/OGT Cardiovascular:  Regular, tachycardic Lungs: Rales bilaterally Abdomen: Soft, bowel sounds diminished Musculoskeletal:  Trace edema, cold / blue LE Skin:  No rash   LABS:  PULMONARY  Recent Labs Lab 01/18/13 1659 01/19/13 0309 01/20/13 0408 01/21/13 0330 01/23/13 0332  PHART 7.303* 7.291* 7.273* 7.277* 7.271*  PCO2ART 43.4 46.3* 54.2* 55.8* 66.2*  PO2ART 66.0* 68.7* 85.4 58.0* 111.0*  HCO3 21.5 21.6 24.0 25.6* 29.5*  TCO2 23 23.0 25.6 27 31.5  O2SAT 91.0 92.4 94.9 82.0 98.0    CBC  Recent Labs Lab 01/22/13 0415 01/23/13 0415 01/24/13 0340  HGB 10.1* 10.4* 9.3*  HCT 33.7* 34.1* 30.0*  WBC 22.1* 19.8* 16.1*  PLT 318 366 372    COAGULATION No results found for this basename: INR,  in the last 168 hours  CARDIAC  No results found for this basename: TROPONINI,  in the last 168 hours No results found for this basename: PROBNP,  in the last 168 hours   CHEMISTRY  Recent Labs Lab 01/20/13 0345 01/21/13 0330 01/22/13 0415 01/23/13 0415 01/24/13 0340  NA 150* 146 147 146 150*  K 3.5* 4.1  4.6 3.8 3.4*  CL 116* 110 110 105 105  CO2 24 27 28 31  36*  GLUCOSE 146* 131* 144* 118* 111*  BUN 42* 41* 42* 52* 63*  CREATININE 1.00 0.95 0.94 1.18* 1.37*  CALCIUM 8.8 8.8 9.2 9.3 9.0  MG  --  1.8 2.0  --   --   PHOS  --  3.5 4.8*  --   --    Estimated Creatinine Clearance: 29.3 ml/min (by C-G formula based on Cr of 1.37).   LIVER No results found for this basename: AST, ALT, ALKPHOS, BILITOT, PROT, ALBUMIN, INR,  in the last 168 hours   INFECTIOUS No results found for this basename: LATICACIDVEN, PROCALCITON,  in the last 168 hours   ENDOCRINE CBG (last 3)   Recent Labs  01/23/13 1938 01/23/13 2333 01/24/13 0339  GLUCAP 147* 152* 109*         IMAGING x48h  Dg Chest Port 1  View  01/24/2013   CLINICAL DATA:  Bilateral airspace disease.  EXAM: PORTABLE CHEST - 1 VIEW  COMPARISON:  01/23/2013  FINDINGS: Endotracheal tube is 2.2 cm above the carina. Central venous catheter is in the SVC region. There is persistent bilateral parenchymal lung disease that is slightly more confluent in the right upper lung. Bibasilar densities are suggestive for pleural fluid. Heart size is within normal limits. Nasogastric tube extends into the stomach. No evidence for a pneumothorax.  IMPRESSION: No change in the bilateral parenchymal lung disease.  Support apparatuses appear to be appropriately positioned.  Probable bilateral pleural effusions.   Electronically Signed   By: Richarda OverlieAdam  Henn M.D.   On: 01/24/2013 08:13   Dg Chest Port 1 View  01/23/2013   CLINICAL DATA:  Check bilateral infiltrates  EXAM: PORTABLE CHEST - 1 VIEW  COMPARISON:  01/22/2013  FINDINGS: The endotracheal tube is stable in appearance approximately 1.5 cm above the carina. A left-sided central venous line is again seen and stable. A nasogastric catheter is noted within the stomach. The cardiac shadow is stable. Diffuse bilateral airspace opacities are seen. Given some technical variations in the imaging the overall appearance is stable. No pneumothorax or sizable effusion is seen.  IMPRESSION: Stable bilateral airspace disease.  Tubes and lines as described.   Electronically Signed   By: Alcide CleverMark  Lukens M.D.   On: 01/23/2013 07:53      ASSESSMENT / PLAN:  PULMONARY A:  Acute respiratory failure. Aspiration pneumonia. ARDS 1/7.15L on 50% fio2, peep 10. Of nimbex  P:   Goal pH>7.30, SpO2>92, PLP<30 Continuous mechanical support, ARDS Net 6 cc/kg Conti ARDS protocol Permissive hypercapnea VAP bundle Hold SBT Trend ABG/CXR Albuterol / Atrovent PRN Mucomyst  CARDIOVASCULAR A:  Septic shock secondary to pneumonia.  01/24/13: On lower dose presso need   P:  Goal MAP>60 Levophed gtt  RENAL    A: AKI -  resolved. Hyperkalemia - resolved. Hypokalemia - resolved. Metabolic acidosis - resolved. Hypernatremia Positive fluid balance: stay pos 13L --> 12L, CVP 15  01/24/13: slighly worse renal function  P:   Trend BMP Repeat Lasix 40 q6h x 4 doses;l hold lsix 01/24/13  GASTROINTESTINAL A:  Nutrition. Diarrhea, C diff negative. GI Px. P: TF Protonix  HEMATOLOGIC A:  Leukocytosis.  Anemia of critical illness. DVT Px. P:  Trend CBC Heparin [place] PRBC for hgb < 7gm% only  INFECTIOUS A:   Presumed aspiration pneumonia, cx negative. P: Abx / culture as above  ENDOCRINE A:   No acute issues. P: SSI  NEUROLOGIC A:  Acute encephalopathy.  01/24/13: RN reporting general flacidity after coming off nimbe   P: Fentanyl / Versed Hold WUA Consider workup if motor function does not improve  Goals of care meeting 01/26/13 per PCCM note 01/23/13; details not known. No family at bedside 01/24/13  I have personally obtained history, examined patient, evaluated and interpreted laboratory and imaging results, reviewed medical records, formulated assessment / plan and placed orders.  CRITICAL CARE:  The patient is critically ill with multiple organ systems failure and requires high complexity decision making for assessment and support, frequent evaluation and titration of therapies, application of advanced monitoring technologies and extensive interpretation of multiple databases. Critical Care Time devoted to patient care services described in this note is 35 minutes.     Dr. Kalman Shan, M.D., [facility].C.P Pulmonary and Critical Care Medicine Staff Physician [facility] [place] Pulmonary and Critical Care Pager: [phone], If no answer or between  15:00h - 7:00h: call [phone]  01/24/2013 12:06 PM

## 2013-01-24 NOTE — Progress Notes (Addendum)
Wasted 20cc Versed in sink.  Witnessed by Roselie AwkwardShannon Lemoyne Nestor, RN.

## 2013-01-24 NOTE — Progress Notes (Addendum)
Wasted 20cc fentanyl in sink.  Witnessed by Okey DupreJennifer Charese Abundis, RN.  Roselie AwkwardShannon Love, RN   Witnessed waste.  Okey DupreJennifer Najae Filsaime, RN

## 2013-01-24 NOTE — Progress Notes (Signed)
[facility] FOR AM LAB REPLACEMENT ONLY  The patient does not apply for the [facility] Electrolyte [facility] Protocol based on the criteria listed below:   1. Is GFR >/= 40 ml/min? no  Patient's GFR today is 35 2. Is urine output >/= 0.5 ml/kg/hr for the last 6 hours? no Patient's UOP is  ml/kg/hr 3. Is BUN < 60 mg/dL? no  Patient's BUN today is 63 4. Abnormal electrolyte(s): K 3.4 5. Ordered repletion with: NA 6. If a panic level lab has been reported, has the CCM MD in charge been notified? yes.   Physician:  Dr Darrick Pennadeterding  Barnetta ChapelWHELAN, Bridie Colquhoun A 01/24/2013 4:39 AM

## 2013-01-24 NOTE — Significant Event (Signed)
Watery diarrhea   C diff ordered Rectal tube ordered Barrier cream as needed   [name], MD ; [facility] service Mobile [phone].  After 5:30 PM or weekends, call [phone]

## 2013-01-25 ENCOUNTER — Inpatient Hospital Stay (HOSPITAL_COMMUNITY): Payer: Medicare Other

## 2013-01-25 DIAGNOSIS — J80 Acute respiratory distress syndrome: Secondary | ICD-10-CM | POA: Diagnosis present

## 2013-01-25 LAB — CBC WITH DIFFERENTIAL/PLATELET
Basophils Absolute: 0.1 10*3/uL (ref 0.0–0.1)
Basophils Relative: 0 % (ref 0–1)
EOS PCT: 1 % (ref 0–5)
Eosinophils Absolute: 0.1 10*3/uL (ref 0.0–0.7)
HCT: 29.4 % — ABNORMAL LOW (ref 36.0–46.0)
Hemoglobin: 9.1 g/dL — ABNORMAL LOW (ref 12.0–15.0)
LYMPHS PCT: 8 % — AB (ref 12–46)
Lymphs Abs: 1.1 10*3/uL (ref 0.7–4.0)
MCH: 31.6 pg (ref 26.0–34.0)
MCHC: 31 g/dL (ref 30.0–36.0)
MCV: 102.1 fL — ABNORMAL HIGH (ref 78.0–100.0)
MONOS PCT: 5 % (ref 3–12)
Monocytes Absolute: 0.7 10*3/uL (ref 0.1–1.0)
Neutro Abs: 12.9 10*3/uL — ABNORMAL HIGH (ref 1.7–7.7)
Neutrophils Relative %: 86 % — ABNORMAL HIGH (ref 43–77)
PLATELETS: 381 10*3/uL (ref 150–400)
RBC: 2.88 MIL/uL — AB (ref 3.87–5.11)
RDW: 15.4 % (ref 11.5–15.5)
WBC: 15 10*3/uL — AB (ref 4.0–10.5)

## 2013-01-25 LAB — POCT I-STAT 3, ART BLOOD GAS (G3+)
ACID-BASE EXCESS: 13 mmol/L — AB (ref 0.0–2.0)
Acid-Base Excess: 13 mmol/L — ABNORMAL HIGH (ref 0.0–2.0)
Bicarbonate: 39.7 mEq/L — ABNORMAL HIGH (ref 20.0–24.0)
Bicarbonate: 41 mEq/L — ABNORMAL HIGH (ref 20.0–24.0)
O2 SAT: 94 %
O2 SAT: 95 %
PCO2 ART: 72 mmHg — AB (ref 35.0–45.0)
Patient temperature: 97.9
TCO2: 42 mmol/L (ref 0–100)
TCO2: 43 mmol/L (ref 0–100)
pCO2 arterial: 60.9 mmHg (ref 35.0–45.0)
pH, Arterial: 7.42 (ref 7.350–7.450)
pH, Arterial: 7.363 (ref 7.350–7.450)
pO2, Arterial: 71 mmHg — ABNORMAL LOW (ref 80.0–100.0)
pO2, Arterial: 85 mmHg (ref 80.0–100.0)

## 2013-01-25 LAB — GLUCOSE, CAPILLARY
GLUCOSE-CAPILLARY: 151 mg/dL — AB (ref 70–99)
GLUCOSE-CAPILLARY: 169 mg/dL — AB (ref 70–99)
Glucose-Capillary: 116 mg/dL — ABNORMAL HIGH (ref 70–99)
Glucose-Capillary: 124 mg/dL — ABNORMAL HIGH (ref 70–99)
Glucose-Capillary: 113 mg/dL — ABNORMAL HIGH (ref 70–99)
Glucose-Capillary: 179 mg/dL — ABNORMAL HIGH (ref 70–99)

## 2013-01-25 LAB — PRO B NATRIURETIC PEPTIDE: Pro B Natriuretic peptide (BNP): 18187 pg/mL — ABNORMAL HIGH (ref 0–450)

## 2013-01-25 LAB — MAGNESIUM: MAGNESIUM: 2.2 mg/dL (ref 1.5–2.5)

## 2013-01-25 LAB — PHOSPHORUS: PHOSPHORUS: 4.2 mg/dL (ref 2.3–4.6)

## 2013-01-25 LAB — CLOSTRIDIUM DIFFICILE BY PCR: Toxigenic C. Difficile by PCR: NEGATIVE

## 2013-01-25 LAB — TROPONIN I: TROPONIN I: 0.76 ng/mL — AB (ref <0.30)

## 2013-01-25 MED ORDER — MIDAZOLAM HCL 2 MG/2ML IJ SOLN: 2.0000 mg | INTRAMUSCULAR | Status: DC | PRN

## 2013-01-25 MED ORDER — HYDRALAZINE HCL 20 MG/ML IJ SOLN
20.0000 mg | INTRAMUSCULAR | Status: DC | PRN
  Filled 2013-01-25: qty 1

## 2013-01-25 MED ORDER — FUROSEMIDE 10 MG/ML IJ SOLN
60.0000 mg | Freq: Four times a day (QID) | INTRAMUSCULAR | Status: AC
  Administered 2013-01-25 – 2013-01-26 (×4): 60 mg via INTRAVENOUS
  Filled 2013-01-25 (×4): qty 6

## 2013-01-25 MED ORDER — OXEPA PO LIQD
1000.0000 mL | ORAL | Status: DC
  Administered 2013-01-25 – 2013-01-27 (×3): 1000 mL
  Filled 2013-01-25 (×6): qty 1000

## 2013-01-25 MED ORDER — RANITIDINE HCL 150 MG/10ML PO SYRP
150.0000 mg | ORAL_SOLUTION | Freq: Two times a day (BID) | ORAL | Status: DC
  Administered 2013-01-25 – 2013-01-28 (×8): 150 mg
  Filled 2013-01-25 (×12): qty 10

## 2013-01-25 MED ORDER — FUROSEMIDE 10 MG/ML IJ SOLN: 40.0000 mg | Freq: Four times a day (QID) | INTRAMUSCULAR | Status: DC

## 2013-01-25 MED ORDER — MIDAZOLAM HCL 2 MG/2ML IJ SOLN
2.0000 mg | INTRAMUSCULAR | Status: DC | PRN
  Administered 2013-01-25 – 2013-01-28 (×10): 2 mg via INTRAVENOUS
  Filled 2013-01-25 (×11): qty 2

## 2013-01-25 NOTE — Progress Notes (Signed)
NUTRITION FOLLOW UP  Intervention:    Decrease Oxepa formula to goal rate of 30 ml/hr  Continue Prostat liquid protein 30 ml TID via tube  Total EN regimen to provide 1480 kcals, 90 gm protein, 565 ml of free water RD to follow for nutrition care plan  Nutrition Dx:   Inadequate oral intake related to inability to eat as evidenced by NPO status, ongoing  Goal:   EN to meet > 90% of estimated nutrition needs, met  Monitor:   EN regimen & tolerance, goals of care, respiratory status, weight, labs, I/O's  Assessment:   Patient with PMH of arthritis and HTN , lives at home, presented with severe PNA and septic shock.   Patient is currently intubated on ventilator support MV: 7.9 L/min Temp (24hrs), Avg:97.8 F (36.6 C), Min:97.5 F (36.4 C), Max:98.1 F (36.7 C)   Oxepa formula infusing at goal rate of 35 ml/hr via OGT with Prostat liquid protein 30 ml 3 times daily providing 1560 kcals, 98 gm protein, 659 ml of free water.  Liquid MVI daily via tube.  ARDS Protocol in place.  Nimbex D/C'd.  Palliative Care Team consulted -- family meeting tomorrow.  Height: Ht Readings from Last 1 Encounters:  12/29/2012 _0  (1.6 m)    Weight Status ---> fluctuating   Wt Readings from Last 1 Encounters:  01/25/13 158 lb 15.2 oz (72.1 kg)    1/7 154 lb 1/6 164 lb 1/5 166 lb 1/4 151 lb 1/3 151 lb 1/2 148 lb 1/1 149 lb  Re-estimated needs:  Kcal: 1300-1450 Protein: 95-105 gm Fluid: 1.5-1.6 L  Skin: Intact  Diet Order: NPO   Intake/Output Summary (Last 24 hours) at 01/25/13 1032 Last data filed at 01/25/13 1000  Gross per 24 hour  Intake 1676.67 ml  Output   1665 ml  Net  11.67 ml    Labs:   Recent Labs Lab 01/21/13 0330 01/22/13 0415 01/23/13 0415 01/24/13 0340 01/25/13 0400  NA 146 147 146 150*  --   K 4.1 4.6 3.8 3.4*  --   CL 110 110 105 105  --   CO2 _1 36*  --   BUN 41* 42* 52* 63*  --   CREATININE 0.95 0.94 1.18* 1.37*  --   CALCIUM 8.8 9.2  9.3 9.0  --   MG 1.8 2.0  --   --  2.2  PHOS 3.5 4.8*  --   --  4.2  GLUCOSE 131* 144* 118* 111*  --     CBG (last 3)   Recent Labs  01/25/13 01/25/13 0402 01/25/13 0824  GLUCAP 124* 116* 113*    Scheduled Meds: . antiseptic oral rinse  1 application Mouth Rinse QID  . chlorhexidine  15 mL Mouth/Throat BID  . feeding supplement (OXEPA)  1,000 mL Per Tube Q24H  . feeding supplement (PRO-STAT SUGAR FREE 64)  30 mL Per Tube TID  . furosemide  60 mg Intravenous Q6H  . heparin  5,000 Units Subcutaneous Q8H  . insulin aspart  2-6 Units Subcutaneous Q4H  . ipratropium-albuterol  3 mL Nebulization Q6H  . multivitamin  5 mL Per Tube Daily  . ranitidine  150 mg Per Tube BID    Continuous Infusions: . fentaNYL infusion INTRAVENOUS 250 mcg/hr (01/25/13 0730)    Arthur Holms, RD, LDN Pager #: [phone] After-Hours Pager #: [phone]

## 2013-01-25 NOTE — Progress Notes (Signed)
CRITICAL VALUE ALERT  Critical value received:  Troponin 0.76  Date of notification:  01/25/13  Time of notification:  0544  Critical value read back:yes  Nurse who received alert:  Okey DupreJennifer Parrish, RN  MD notified (1st page):  Called Elink  Time of first page:  [phone]  MD notified (2nd page):  Time of second page:  Responding MD:  Pola CornElink MD  Time MD responded:  [phone]

## 2013-01-25 NOTE — Progress Notes (Signed)
Pt had oxygenation issues throughout shift.  Recruitment maneuvers were done x3 during shift to help attempt to achieve better oxygenation.  Pt tolerated well without incident.

## 2013-01-25 NOTE — Progress Notes (Signed)
PT Cancellation Note  Patient Details Name: Burna Cashunice Spagnuolo MRN: [id number] DOB: [birth year]   Cancelled Treatment:    Reason Eval/Treat Not Completed: Medical issues which prohibited therapy 01/25/2013   BingKen Tonette Koehne, PT [phone]  (pager)   Dinita Migliaccio, Eliseo GumKenneth V 01/25/2013, 4:46 PM

## 2013-01-25 NOTE — Progress Notes (Signed)
PULMONARY / CRITICAL CARE MEDICINE  Name: Briana Miranda MRN: [id number] DOB: 11-Apr-1927    ADMISSION DATE:  12/22/2012 CONSULTATION DATE:  01/10/2013  CHIEF COMPLAINT:  Dyspnea  BRIEF PATIENT DESCRIPTION: 80 yo transferred to [facility] with severe pneumonia, septic shock and ARDS.  SIGNIFICANT EVENTS / STUDIES: 12/29  Transfer to [facility] 1/3 LVEF 60-65%, diastolic dysfunction, mod to severe dilated RV 1/5       Bronchoscopy >>> ETT obstruction with dried secretions  LINES / TUBES: L IJ TLC 12/29 >>> OETT 12/30 >>> OGT 12/30 >>> Foley 12/29 >>>  CULTURES: 12/29  MRSA PCR >>> neg 12/29  Blood >>> neg 12/29  Urine >>> neg 12/29  Legionella Ag >>> neg 12/29  Pneumococcal Ag >>> neg 12/29  Influenza PCR  >>> neg 12/31  Respiratory >>> neg 1/7 C DIFF >>NEG  ANTIBIOTICS: Vancomycin 12/29 >>> 1/5 Zosyn 12/29 >>>1/8 Azithromycin 12/29 >>> d/c'd  INTERVAL HISTORY:  Off NMB, I>>>O POS.  Off pressors  VITAL SIGNS: Temp:  [97.5 F (36.4 C)-98.1 F (36.7 C)] 98.1 F (36.7 C) (01/08 0827) Pulse Rate:  [83-97] 90 (01/08 0600) Resp:  [15-25] 24 (01/08 0600) BP: (99-161)/(37-59) 161/56 mmHg (01/08 0600) SpO2:  [89 %-100 %] 97 % (01/08 0730) FiO2 (%):  [40 %-50 %] 50 % (01/08 0730) Weight:  [72.1 kg (158 lb 15.2 oz)] 72.1 kg (158 lb 15.2 oz) (01/08 0200)  HEMODYNAMICS: CVP:  [9 mmHg-11 mmHg] 11 mmHg Off NE  VENTILATOR SETTINGS: Vent Mode:  [-] PRVC FiO2 (%):  [40 %-50 %] 50 % Set Rate:  [24 bmp] 24 bmp Vt Set:  [370 mL] 370 mL PEEP:  [8 cmH20] 8 cmH20 Plateau Pressure:  [15 cmH20-24 cmH20] 24 cmH20  INTAKE / OUTPUT: Intake/Output     01/07 0701 - 01/08 0700 01/08 0701 - 01/09 0700   P.O. 120    I.V. (mL/kg) 716.4 (9.9)    NG/GT 870    IV Piggyback 150    Total Intake(mL/kg) 1856.4 (25.7)    Urine (mL/kg/hr) 1950 (1.1) 150 (1.3)   Stool  100 (0.9)   Total Output 1950 250   Net -93.6 -250        Stool Occurrence 4 x     I>>>O 10L since adm  PHYSICAL  EXAMINATION: General: Mechanically ventilated Neuro: Sedated, RASS -4 not moving limbs, no gag per RN. Only flutters eye lids HEENT: ETT/OGT Cardiovascular:  Regular, tachycardic  4+edema Lungs: Rales bilaterally Abdomen: Soft, bowel sounds diminished Musculoskeletal:  perfused Skin:  No rash   LABS:  PULMONARY  Recent Labs Lab 01/18/13 1659 01/19/13 0309 01/20/13 0408 01/21/13 0330 01/23/13 0332  PHART 7.303* 7.291* 7.273* 7.277* 7.271*  PCO2ART 43.4 46.3* 54.2* 55.8* 66.2*  PO2ART 66.0* 68.7* 85.4 58.0* 111.0*  HCO3 21.5 21.6 24.0 25.6* 29.5*  TCO2 23 23.0 25.6 27 31.5  O2SAT 91.0 92.4 94.9 82.0 98.0    CBC  Recent Labs Lab 01/23/13 0415 01/24/13 0340 01/25/13 0400  HGB 10.4* 9.3* 9.1*  HCT 34.1* 30.0* 29.4*  WBC 19.8* 16.1* 15.0*  PLT 366 372 381    COAGULATION No results found for this basename: INR,  in the last 168 hours  CARDIAC    Recent Labs Lab 01/25/13 0400  TROPONINI 0.76*    Recent Labs Lab 01/25/13 0400  PROBNP 18187.0*     CHEMISTRY  Recent Labs Lab 01/20/13 0345 01/21/13 0330 01/22/13 0415 01/23/13 0415 01/24/13 0340 01/25/13 0400  NA 150* 146 147 146 150*  --  K 3.5* 4.1 4.6 3.8 3.4*  --   CL 116* 110 110 105 105  --   CO2 24 27 28 31  36*  --   GLUCOSE 146* 131* 144* 118* 111*  --   BUN 42* 41* 42* 52* 63*  --   CREATININE 1.00 0.95 0.94 1.18* 1.37*  --   CALCIUM 8.8 8.8 9.2 9.3 9.0  --   MG  --  1.8 2.0  --   --  2.2  PHOS  --  3.5 4.8*  --   --  4.2   Estimated Creatinine Clearance: 29.6 ml/min (by C-G formula based on Cr of 1.37).   LIVER No results found for this basename: AST, ALT, ALKPHOS, BILITOT, PROT, ALBUMIN, INR,  in the last 168 hours   INFECTIOUS No results found for this basename: LATICACIDVEN, PROCALCITON,  in the last 168 hours   ENDOCRINE CBG (last 3)   Recent Labs  01/25/13 01/25/13 0402 01/25/13 0824  GLUCAP 124* 116* 113*    IMAGING x48h  Dg Chest Port 1 View  01/25/2013    CLINICAL DATA:  Bilateral pleural effusions.  EXAM: PORTABLE CHEST - 1 VIEW  COMPARISON:  January 24, 2013.  FINDINGS: Cardiomediastinal silhouette appears normal. Endotracheal tube is in grossly good position with distal tip 2.5 cm above the carina. Nasogastric tube is seen passing through the esophagus into the stomach. Left internal jugular venous catheter is unchanged in position with tip in expected position of the SVC. Diffuse interstitial densities are noted throughout both lungs concerning for edema or possibly pneumonia. Focal right upper lobe airspace opacity is noted concerning for pneumonia. Probable minimal-to-mild bilateral pleural effusions are noted.  IMPRESSION: Probable minimal-to-mild bilateral pleural effusions. Endotracheal and nasogastric tubes in grossly good position. Stable diffuse interstitial densities are noted throughout both lungs concerning for edema or pneumonia. Probable right upper lobe pneumonia is again noted and unchanged.   Electronically Signed   By: Roque LiasJames  Green M.D.   On: 01/25/2013 07:47   Dg Chest Port 1 View  01/24/2013   CLINICAL DATA:  Bilateral airspace disease.  EXAM: PORTABLE CHEST - 1 VIEW  COMPARISON:  01/23/2013  FINDINGS: Endotracheal tube is 2.2 cm above the carina. Central venous catheter is in the SVC region. There is persistent bilateral parenchymal lung disease that is slightly more confluent in the right upper lung. Bibasilar densities are suggestive for pleural fluid. Heart size is within normal limits. Nasogastric tube extends into the stomach. No evidence for a pneumothorax.  IMPRESSION: No change in the bilateral parenchymal lung disease.  Support apparatuses appear to be appropriately positioned.  Probable bilateral pleural effusions.   Electronically Signed   By: Richarda OverlieAdam  Henn M.D.   On: 01/24/2013 08:13   ASSESSMENT / PLAN:  PULMONARY A:  Acute respiratory failure. Aspiration pneumonia. ARDS I>>O pos 10L   P:   Diurese ARDS vent No  SBT WUA daily Adjust peep Needs TRach   CARDIOVASCULAR A:  Septic shock RESOLVED BNP 18K    P:  Goal MAP>60 Off vasopressors   RENAL A: AKI - resolved. Hyperkalemia - resolved. Hypokalemia - resolved. Metabolic acidosis - resolved. Hypernatremia Positive fluid balance: stay pos 13L --> 12L, CVP 15  P:   Cont lasix IV on schedule  GASTROINTESTINAL A:  Nutrition. Diarrhea, C diff negative. GI Px. P: TF Change PPI to H2 as PPI can contribute to diarrhea  HEMATOLOGIC A:  Leukocytosis.  Anemia of critical illness. DVT Px. P:  Trend CBC Heparin  [place] PRBC for hgb < 7gm% only  INFECTIOUS A:   Presumed aspiration pneumonia, cx negative. P: D/c ABX, give a abx holiday  ENDOCRINE A:   No acute issues. P: SSI  NEUROLOGIC A:  Acute encephalopathy. 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  Pt will need trach.    I have personally obtained history, examined patient, evaluated and interpreted laboratory and imaging results, reviewed medical records, formulated assessment / plan and placed orders.  CRITICAL CARE:  The patient is critically ill with multiple organ systems failure and requires high complexity decision making for assessment and support, frequent evaluation and titration of therapies, application of advanced monitoring technologies and extensive interpretation of multiple databases. Critical Care Time devoted to patient care services described in this note is 40 minutes.    Dorcas Carrow Beeper  [phone]  Cell  [phone]  If no response or cell goes to voicemail, call beeper [phone]  01/25/2013 8:38 AM

## 2013-01-25 NOTE — Progress Notes (Signed)
Recruitment maneuver performed x1 on patient today to increase oxygenation.  Pt tolerated well without incident.

## 2013-01-26 ENCOUNTER — Inpatient Hospital Stay (HOSPITAL_COMMUNITY): Payer: Medicare Other

## 2013-01-26 DIAGNOSIS — Z515 Encounter for palliative care: Secondary | ICD-10-CM

## 2013-01-26 DIAGNOSIS — R531 Weakness: Secondary | ICD-10-CM

## 2013-01-26 DIAGNOSIS — E876 Hypokalemia: Secondary | ICD-10-CM | POA: Diagnosis present

## 2013-01-26 DIAGNOSIS — J9589 Other postprocedural complications and disorders of respiratory system, not elsewhere classified: Secondary | ICD-10-CM

## 2013-01-26 DIAGNOSIS — G7281 Critical illness myopathy: Secondary | ICD-10-CM | POA: Diagnosis not present

## 2013-01-26 LAB — BASIC METABOLIC PANEL
BUN: 58 mg/dL — ABNORMAL HIGH (ref 6–23)
BUN: 55 mg/dL — ABNORMAL HIGH (ref 6–23)
CALCIUM: 9.2 mg/dL (ref 8.4–10.5)
CO2: 41 mEq/L (ref 19–32)
CO2: 43 mEq/L (ref 19–32)
CREATININE: 1.21 mg/dL — AB (ref 0.50–1.10)
Calcium: 9.5 mg/dL (ref 8.4–10.5)
Chloride: 104 mEq/L (ref 96–112)
Chloride: 103 mEq/L (ref 96–112)
Creatinine, Ser: 1.2 mg/dL — ABNORMAL HIGH (ref 0.50–1.10)
GFR calc Af Amer: 47 mL/min — ABNORMAL LOW (ref >90)
GFR calc Af Amer: 47 mL/min — ABNORMAL LOW (ref >90)
GFR, EST NON AFRICAN AMERICAN: 40 mL/min — AB (ref >90)
GFR, EST NON AFRICAN AMERICAN: 41 mL/min — AB (ref >90)
GLUCOSE: 130 mg/dL — AB (ref 70–99)
GLUCOSE: 72 mg/dL (ref 70–99)
POTASSIUM: 3.8 meq/L (ref 3.7–5.3)
Potassium: 3 mEq/L — ABNORMAL LOW (ref 3.7–5.3)
SODIUM: 155 meq/L — AB (ref 137–147)
Sodium: 156 mEq/L — ABNORMAL HIGH (ref 137–147)

## 2013-01-26 LAB — POCT I-STAT 3, ART BLOOD GAS (G3+)
Acid-Base Excess: 26 mmol/L — ABNORMAL HIGH (ref 0.0–2.0)
BICARBONATE: 52.7 meq/L — AB (ref 20.0–24.0)
O2 Saturation: 83 %
PCO2 ART: 67.6 mmHg — AB (ref 35.0–45.0)
PH ART: 7.502 — AB (ref 7.350–7.450)
TCO2: >50 mmol/L (ref 0–100)
pO2, Arterial: 47 mmHg — ABNORMAL LOW (ref 80.0–100.0)

## 2013-01-26 LAB — CBC WITH DIFFERENTIAL/PLATELET
Basophils Absolute: 0.1 10*3/uL (ref 0.0–0.1)
Basophils Relative: 0 % (ref 0–1)
EOS ABS: 0.2 10*3/uL (ref 0.0–0.7)
Eosinophils Relative: 1 % (ref 0–5)
HCT: 33.5 % — ABNORMAL LOW (ref 36.0–46.0)
Hemoglobin: 10.1 g/dL — ABNORMAL LOW (ref 12.0–15.0)
Lymphocytes Relative: 11 % — ABNORMAL LOW (ref 12–46)
Lymphs Abs: 2 10*3/uL (ref 0.7–4.0)
MCH: 31.6 pg (ref 26.0–34.0)
MCHC: 30.1 g/dL (ref 30.0–36.0)
MCV: 104.7 fL — AB (ref 78.0–100.0)
MONO ABS: 1.1 10*3/uL — AB (ref 0.1–1.0)
Monocytes Relative: 6 % (ref 3–12)
Neutro Abs: 15.5 10*3/uL — ABNORMAL HIGH (ref 1.7–7.7)
Neutrophils Relative %: 82 % — ABNORMAL HIGH (ref 43–77)
PLATELETS: 415 10*3/uL — AB (ref 150–400)
RBC: 3.2 MIL/uL — ABNORMAL LOW (ref 3.87–5.11)
RDW: 15.7 % — AB (ref 11.5–15.5)
WBC: 18.9 10*3/uL — ABNORMAL HIGH (ref 4.0–10.5)

## 2013-01-26 LAB — GLUCOSE, CAPILLARY
GLUCOSE-CAPILLARY: 98 mg/dL (ref 70–99)
GLUCOSE-CAPILLARY: 116 mg/dL — AB (ref 70–99)
GLUCOSE-CAPILLARY: 74 mg/dL (ref 70–99)
GLUCOSE-CAPILLARY: 107 mg/dL — AB (ref 70–99)
Glucose-Capillary: 112 mg/dL — ABNORMAL HIGH (ref 70–99)
Glucose-Capillary: 126 mg/dL — ABNORMAL HIGH (ref 70–99)
Glucose-Capillary: 154 mg/dL — ABNORMAL HIGH (ref 70–99)

## 2013-01-26 MED ORDER — FLORANEX PO PACK
1.0000 g | PACK | Freq: Every day | ORAL | Status: DC
  Filled 2013-01-26: qty 1

## 2013-01-26 MED ORDER — FENTANYL 50 MCG/ML INFUSION
25.0000 ug/h | INTRAMUSCULAR | Status: DC
  Administered 2013-01-26: 300 ug/h via INTRAVENOUS
  Administered 2013-01-26: 275 ug/h via INTRAVENOUS
  Administered 2013-01-26: 300 ug/h via INTRAVENOUS
  Administered 2013-01-27: 275 ug/h via INTRAVENOUS
  Administered 2013-01-28: 325 ug/h via INTRAVENOUS
  Administered 2013-01-29: 375 ug/h via INTRAVENOUS
  Filled 2013-01-26 (×6): qty 100

## 2013-01-26 MED ORDER — POTASSIUM CHLORIDE 20 MEQ/15ML (10%) PO LIQD
30.0000 meq | ORAL | Status: AC
  Administered 2013-01-26 (×2): 30 meq
  Filled 2013-01-26 (×2): qty 22.5

## 2013-01-26 MED ORDER — METOPROLOL TARTRATE 25 MG/10 ML ORAL SUSPENSION
25.0000 mg | Freq: Two times a day (BID) | ORAL | Status: DC
  Filled 2013-01-26 (×2): qty 10

## 2013-01-26 MED ORDER — FUROSEMIDE 10 MG/ML IJ SOLN
60.0000 mg | Freq: Four times a day (QID) | INTRAMUSCULAR | Status: AC
  Administered 2013-01-26 (×3): 60 mg via INTRAVENOUS
  Filled 2013-01-26 (×2): qty 6

## 2013-01-26 MED ORDER — SODIUM CHLORIDE 0.9 % IJ SOLN
10.0000 mL | Freq: Two times a day (BID) | INTRAMUSCULAR | Status: DC
  Administered 2013-01-26: 10 mL
  Administered 2013-01-27: 30 mL
  Administered 2013-01-27 – 2013-01-28 (×3): 10 mL

## 2013-01-26 MED ORDER — FUROSEMIDE 10 MG/ML IJ SOLN
INTRAMUSCULAR | Status: AC
  Administered 2013-01-26: 08:00:00 60 mg via INTRAVENOUS
  Filled 2013-01-26: qty 8

## 2013-01-26 MED ORDER — METOPROLOL TARTRATE 25 MG/10 ML ORAL SUSPENSION: 50.0000 mg | Freq: Two times a day (BID) | ORAL | Status: DC

## 2013-01-26 MED ORDER — METOPROLOL TARTRATE 25 MG/10 ML ORAL SUSPENSION
50.0000 mg | Freq: Two times a day (BID) | ORAL | Status: DC
  Administered 2013-01-26: 50 mg
  Filled 2013-01-26 (×2): qty 20

## 2013-01-26 MED ORDER — FENTANYL 50 MCG/ML INFUSION
25.0000 ug/h | INTRAMUSCULAR | Status: DC
  Filled 2013-01-26: qty 100

## 2013-01-26 MED ORDER — FENTANYL 75 MCG/HR TD PT72
100.0000 ug | MEDICATED_PATCH | TRANSDERMAL | Status: DC
  Administered 2013-01-26: 100 ug via TRANSDERMAL
  Filled 2013-01-26 (×2): qty 1

## 2013-01-26 MED ORDER — SODIUM CHLORIDE 0.9 % IJ SOLN: 10.0000 mL | INTRAMUSCULAR | Status: DC | PRN

## 2013-01-26 MED ORDER — RISAQUAD PO CAPS
1.0000 | ORAL_CAPSULE | Freq: Every day | ORAL | Status: DC
  Administered 2013-01-26 – 2013-01-28 (×3): 1 via ORAL
  Filled 2013-01-26 (×5): qty 1

## 2013-01-26 MED ORDER — METOPROLOL TARTRATE 25 MG/10 ML ORAL SUSPENSION
100.0000 mg | Freq: Two times a day (BID) | ORAL | Status: DC
  Administered 2013-01-26 – 2013-01-28 (×5): 100 mg
  Filled 2013-01-26 (×8): qty 40

## 2013-01-26 MED ORDER — FENTANYL BOLUS VIA INFUSION
50.0000 ug | INTRAVENOUS | Status: DC | PRN
  Administered 2013-01-26 (×3): 50 ug via INTRAVENOUS
  Administered 2013-01-29: 100 ug via INTRAVENOUS
  Filled 2013-01-26: qty 100

## 2013-01-26 NOTE — Progress Notes (Addendum)
200 mL Fentanyl from bag WIS. Witness- Tommi EmeryHINTZ, Jordin Vicencio M

## 2013-01-26 NOTE — Progress Notes (Signed)
Peripherally Inserted Central Catheter/Midline Placement  The IV Nurse has discussed with the patient and/or persons authorized to consent for the patient, the purpose of this procedure and the potential benefits and risks involved with this procedure.  The benefits include less needle sticks, lab draws from the catheter and patient may be discharged home with the catheter.  Risks include, but not limited to, infection, bleeding, blood clot (thrombus formation), and puncture of an artery; nerve damage and irregular heat beat.  Alternatives to this procedure were also discussed.  PICC/Midline Placement Documentation  PICC Triple Lumen 01/26/13 PICC Right Basilic 42 cm 0 cm (Active)  Indication for Insertion or Continuance of Line Prolonged intravenous therapies 01/26/2013  5:12 PM  Exposed Catheter (cm) 0 cm 01/26/2013  5:12 PM  Lumen #1 Status Flushed;Saline locked;Blood return noted 01/26/2013  5:12 PM  Lumen #2 Status Flushed;Saline locked;Blood return noted 01/26/2013  5:12 PM  Lumen #3 Status Flushed;Saline locked;Blood return noted 01/26/2013  5:12 PM  Dressing Change Due 02/02/13 01/26/2013  5:12 PM   Consent signed by daughter Joycelyn ManCarol Bernat    Xoie Kreuser Robert 01/26/2013, 5:13 PM

## 2013-01-26 NOTE — Progress Notes (Signed)
[facility] Electrolyte Replacement Protocol  Patient Name: Briana Miranda DOB: [birth year] MRN: [id number]  Date of Service  01/26/2013   HPI/Events of Note    Recent Labs Lab 01/21/13 0330 01/22/13 0415 01/23/13 0415 01/24/13 0340 01/25/13 0400 01/26/13 0410  NA 146 147 146 150*  --  155*  K 4.1 4.6 3.8 3.4*  --  3.0*  CL 110 110 105 105  --  104  CO2 27 28 31  36*  --  41*  GLUCOSE 131* 144* 118* 111*  --  130*  BUN 41* 42* 52* 63*  --  58*  CREATININE 0.95 0.94 1.18* 1.37*  --  1.21*  CALCIUM 8.8 9.2 9.3 9.0  --  9.2  MG 1.8 2.0  --   --  2.2  --   PHOS 3.5 4.8*  --   --  4.2  --     Estimated Creatinine Clearance: 32.4 ml/min (by C-G formula based on Cr of 1.21).  Intake/Output     01/08 0701 - 01/09 0700   I.V. (mL/kg) 893.8 (13.3)   NG/GT 880   Total Intake(mL/kg) 1773.8 (26.4)   Urine (mL/kg/hr) 4015 (2.5)   Stool 200 (0.1)   Total Output 4215   Net -2441.3        - I/O DETAILED x24h    Total I/O In: 855 [I.V.:525; NG/GT:330] Out: 2345 [Urine:2345] - I/O THIS SHIFT    ASSESSMENT   eICURN Interventions  ICU Electrolyte Replacement Protocol criteria met.Labs Replaced per protocol.MD notified K+ 3.0    ASSESSMENT: MAJOR ELECTROLYTE    Briana Miranda 01/26/2013, 6:02 AM

## 2013-01-26 NOTE — Consult Note (Signed)
Patient ZO:XWRUEA:Briana Miranda      DOB: 16-Oct-1927      VWU:[id number]     [facility]    Consult Requested by: Dr Vassie LollAlva      PCP: No PCP Per Patient Reason for Consultation:Clarification of GOC and options     Phone Number:[phone]  Assessment of patients Current state:  Patient is 80 yr old with hx of Arthritis and HTN, living alone until this hospital admission. She stated "feeling bad" a few days before Christmas, admitted on 12/25/2012 with PNA, septic shock was intubate and remains intubated at this time. The patient is critically ill with multiple organ systems failure Family is now trying to understand the overall prognosis and options.Their main focus is quality of life for this patient.   This NP Lorinda CreedMary Haygen Zebrowski reviewed medical records, received report from team, assessed the patient and then meet at the patient's bedside along with her daughter Briana AmosCarol Branford, sister [name] McGee and several other family members to discuss diagnosis prognosis, GOC, EOL wishes disposition and options.   A detailed discussion was had today regarding advanced directives.  Concepts specific to code status, artifical feeding and hydration, continued IV antibiotics and rehospitalization was had.  The difference between a aggressive medical intervention path  and a palliative comfort care path for this patient at this time was had.  Values and goals of care important to patient and family were attempted to be elicited.  Concept of Palliative Care was discussed  Natural trajectory and expectations at EOL were discussed.  Questions and concerns addressed.  Hard Choices booklet left for review. Family encouraged to call with questions or concerns.  PMT will continue to support holistically.    Goals of Care: 1.  Code Status:Partial as previously documented   2. Scope of Treatment:  Continue present treatment plan for the next 48 hrs.  They are hopeful for  improvement.  Family will take this time to make decisions regarding possible trach and continued aggressive care.  If no real improvement over the week end family is leaning to a more comfort approach.  Re meet Monday morning at 0900 with this NP to make decisions on treatment plan   4. Disposition:  Dependant on outcomes   3. Symptom Management:   1. Weakness: Continue present treatment plan  4. Psychosocial:  Emotional support offered to family.  All family is gathering together to support each other and make patient centered decisions in light of patient's values and consideration of quality of life.  5. Spiritual:  Spiritual care consulted    Patient Documents Completed or Given: Document Given Completed  Advanced Directives Pkt    MOST    DNR    Gone from My Sight    Hard Choices yes     Brief VHQ:IONGEXBHPI:Patient is 80 yr old with hx of Arthritis and HTN, living alone until this hospital admission. She stated "feeling bad" a few days before Christmas, admitted on 01/09/2013 with PNA, septic shock was intubate and remains intubated at this time. The patient is critically ill with multiple organ systems failure Family is now trying to understand the overall prognosis and options.Their main focus is quality of life for this patient.     MWU:[id number]:unable to illicit    PMH:  Past Medical History  Diagnosis Date  . Hypertension   . Anxiety   . Depression   . Pneumonia   . Arthritis      NUU:VOZDGUYPSH:History  reviewed. No pertinent past surgical history. I have reviewed the FH and SH and  If appropriate update it with new information. No Known Allergies Scheduled Meds: . antiseptic oral rinse  1 application Mouth Rinse QID  . chlorhexidine  15 mL Mouth/Throat BID  . feeding supplement (OXEPA)  1,000 mL Per Tube Q24H  . feeding supplement (PRO-STAT SUGAR FREE 64)  30 mL Per Tube TID  . fentaNYL  100 mcg Transdermal Q72H  . furosemide  60 mg Intravenous Q6H  . heparin  5,000 Units  Subcutaneous Q8H  . insulin aspart  2-6 Units Subcutaneous Q4H  . ipratropium-albuterol  3 mL Nebulization Q6H  . lactobacillus  1 g Oral Daily  . metoprolol tartrate  50 mg Per Tube BID  . multivitamin  5 mL Per Tube Daily  . ranitidine  150 mg Per Tube BID   Continuous Infusions: . fentaNYL infusion INTRAVENOUS 300 mcg/hr (01/26/13 1300)   PRN Meds:.sodium chloride, acetaminophen (TYLENOL) oral liquid 160 mg/5 mL, fentaNYL, hydrALAZINE, midazolam    BP 161/67  Pulse 101  Temp(Src) 99.2 F (37.3 C) (Oral)  Resp 40  Ht 5\' 3"  (1.6 m)  Wt 67.1 kg (147 lb 14.9 oz)  BMI 26.21 kg/m2  SpO2 97%   PPS:20 %   Intake/Output Summary (Last 24 hours) at 01/26/13 1516 Last data filed at 01/26/13 1221  Gross per 24 hour  Intake   1703 ml  Output   4795 ml  Net  -3092 ml    Physical Exam:  General: ill appearing, intubated/ventilated HEENT:  Mm, ETT/OGT noted Chest:   Equal movement, coarse BS noted CVS: tachycardic Abdomen:soft NT +bs Ext: BLE trace edema Neuro:unable to follow commands, eyes open  Labs: CBC    Component Value Date/Time   WBC 18.9* 01/26/2013 0410   RBC 3.20* 01/26/2013 0410   HGB 10.1* 01/26/2013 0410   HCT 33.5* 01/26/2013 0410   PLT 415* 01/26/2013 0410   MCV 104.7* 01/26/2013 0410   MCH 31.6 01/26/2013 0410   MCHC 30.1 01/26/2013 0410   RDW 15.7* 01/26/2013 0410   LYMPHSABS 2.0 01/26/2013 0410   MONOABS 1.1* 01/26/2013 0410   EOSABS 0.2 01/26/2013 0410   BASOSABS 0.1 01/26/2013 0410    BMET    Component Value Date/Time   NA 155* 01/26/2013 0410   K 3.0* 01/26/2013 0410   CL 104 01/26/2013 0410   CO2 41* 01/26/2013 0410   GLUCOSE 130* 01/26/2013 0410   BUN 58* 01/26/2013 0410   CREATININE 1.21* 01/26/2013 0410   CALCIUM 9.2 01/26/2013 0410   GFRNONAA 40* 01/26/2013 0410   GFRAA 47* 01/26/2013 0410    CMP     Component Value Date/Time   NA 155* 01/26/2013 0410   K 3.0* 01/26/2013 0410   CL 104 01/26/2013 0410   CO2 41* 01/26/2013 0410   GLUCOSE 130* 01/26/2013 0410   BUN 58*  01/26/2013 0410   CREATININE 1.21* 01/26/2013 0410   CALCIUM 9.2 01/26/2013 0410   PROT 6.3 12/20/2012 0620   ALBUMIN 1.9* 12/24/2012 0620   AST 45* 12/26/2012 0620   ALT 18 12/30/2012 0620   ALKPHOS 173* 12/30/2012 0620   BILITOT 0.5 01/11/2013 0620   GFRNONAA 40* 01/26/2013 0410   GFRAA 47* 01/26/2013 0410    Time In Time Out Total Time Spent with Patient Total Overall Time  1400 1600 100 min 120 min    Greater than 50%  of this time was spent counseling and coordinating care related to the  above assessment and plan.  Lorinda Creed NP  Palliative Medicine Team Team Phone # [phone] Pager [phone]  Discussed with Dr Delford Field

## 2013-01-26 NOTE — Progress Notes (Addendum)
PULMONARY / CRITICAL CARE MEDICINE  Name: Briana Miranda MRN: [id number] DOB: July 13, 1927    ADMISSION DATE:  12/26/2012 CONSULTATION DATE:  12/25/2012  CHIEF COMPLAINT:  Dyspnea  BRIEF PATIENT DESCRIPTION: 80 yo transferred to [facility] with severe pneumonia, septic shock and ARDS.  SIGNIFICANT EVENTS / STUDIES: 12/29  [facility] LVEF 60-65%, diastolic dysfunction, mod to severe dilated RV 1/5       Bronchoscopy >>> ETT obstruction with dried secretions  LINES / TUBES: L IJ TLC 12/29 >>> OETT 12/30 >>> OGT 12/30 >>> Foley 12/29 >>>  CULTURES: 12/29  MRSA PCR >>> neg 12/29  Blood >>> neg 12/29  Urine >>> neg 12/29  Legionella Ag >>> neg 12/29  Pneumococcal Ag >>> neg 12/29  Influenza PCR  >>> neg 12/31  Respiratory >>> neg 1/7 C DIFF >>NEG  ANTIBIOTICS: Vancomycin 12/29 >>> 1/5 Zosyn 12/29 >>>1/8 Azithromycin 12/29 >>> d/c'd  INTERVAL HISTORY:  Off NMB, I>>>O POS.  Off pressors  VITAL SIGNS: Temp:  [97.9 F (36.6 C)-98.5 F (36.9 C)] 98.2 F (36.8 C) (01/09 0700) Pulse Rate:  [80-120] 80 (01/09 0946) Resp:  [20-41] 41 (01/09 0946) BP: (108-181)/(36-92) 113/45 mmHg (01/09 0900) SpO2:  [92 %-100 %] 97 % (01/09 0946) FiO2 (%):  [50 %-60 %] 50 % (01/09 0745) Weight:  [67.1 kg (147 lb 14.9 oz)] 67.1 kg (147 lb 14.9 oz) (01/09 0500)  HEMODYNAMICS: CVP:  [7 mmHg-17 mmHg] 8 mmHg Off NE  VENTILATOR SETTINGS: Vent Mode:  [-] PRVC FiO2 (%):  [50 %-60 %] 50 % Set Rate:  [30 bmp-35 bmp] 35 bmp Vt Set:  [310 mL] 310 mL PEEP:  [10 cmH20] 10 cmH20 Plateau Pressure:  [15 cmH20-29 cmH20] 20 cmH20  INTAKE / OUTPUT: Intake/Output     01/08 0701 - 01/09 0700 01/09 0701 - 01/10 0700   P.O.     I.V. (mL/kg) 978.8 (14.6) 165 (2.5)   NG/GT 910 120   IV Piggyback     Total Intake(mL/kg) 1888.8 (28.1) 285 (4.2)   Urine (mL/kg/hr) 4190 (2.6) 1275 (6.4)   Stool 275 (0.2) 75 (0.4)   Total Output 4465 1350   Net -2576.3 -1065         Diuresed well     PHYSICAL  EXAMINATION: General: Mechanically ventilated Neuro: Sedated, RASS -3 moving UE better HEENT: ETT/OGT Cardiovascular:  Regular,rate rhythm  Less edema Lungs: Rales bilaterally but decreased Abdomen: Soft, bowel sounds diminished Musculoskeletal:  perfused Skin:  No rash   LABS:  PULMONARY  Recent Labs Lab 01/20/13 0408 01/21/13 0330 01/23/13 0332 01/25/13 1258 01/25/13 1832  PHART 7.273* 7.277* 7.271* 7.420 7.363  PCO2ART 54.2* 55.8* 66.2* 60.9* 72.0*  PO2ART 85.4 58.0* 111.0* 71.0* 85.0  HCO3 24.0 25.6* 29.5* 39.7* 41.0*  TCO2 25.6 27 31.5 42 43  O2SAT 94.9 82.0 98.0 94.0 95.0    CBC  Recent Labs Lab 01/24/13 0340 01/25/13 0400 01/26/13 0410  HGB 9.3* 9.1* 10.1*  HCT 30.0* 29.4* 33.5*  WBC 16.1* 15.0* 18.9*  PLT 372 381 415*    COAGULATION No results found for this basename: INR,  in the last 168 hours  CARDIAC    Recent Labs Lab 01/25/13 0400  TROPONINI 0.76*    Recent Labs Lab 01/25/13 0400  PROBNP 18187.0*     CHEMISTRY  Recent Labs Lab 01/21/13 0330 01/22/13 0415 01/23/13 0415 01/24/13 0340 01/25/13 0400 01/26/13 0410  NA 146 147 146 150*  --  155*  K 4.1 4.6 3.8 3.4*  --  3.0*  CL 110 110 105 105  --  104  CO2 27 28 31  36*  --  41*  GLUCOSE 131* 144* 118* 111*  --  130*  BUN 41* 42* 52* 63*  --  58*  CREATININE 0.95 0.94 1.18* 1.37*  --  1.21*  CALCIUM 8.8 9.2 9.3 9.0  --  9.2  MG 1.8 2.0  --   --  2.2  --   PHOS 3.5 4.8*  --   --  4.2  --    Estimated Creatinine Clearance: 32.4 ml/min (by C-G formula based on Cr of 1.21).   LIVER No results found for this basename: AST, ALT, ALKPHOS, BILITOT, PROT, ALBUMIN, INR,  in the last 168 hours   INFECTIOUS No results found for this basename: LATICACIDVEN, PROCALCITON,  in the last 168 hours   ENDOCRINE CBG (last 3)   Recent Labs  01/25/13 2324 01/25/13 2325 01/26/13 0728  GLUCAP 169* 179* 126*    IMAGING x48h  Dg Chest Port 1 View  01/26/2013   CLINICAL DATA:   ARDS  EXAM: PORTABLE CHEST - 1 VIEW  COMPARISON:  January 25, 2013  FINDINGS: There is persistent diffuse airspace opacity throughout both lungs. Denser consolidation of the right upper to mid lung is unchanged. There probable minimal bilateral pleural effusions. Endotracheal tube, left central venous line, nasogastric tube are stable compared to prior exam. The soft tissues and osseous structures are stable.  IMPRESSION: Persistent diffuse airspace disease throughout both lungs consistent with patient's diagnosis of ARDS. Denser consolidation in the right upper to mid lung unchanged concerning for focal pneumonia.   Electronically Signed   By: Sherian Rein M.D.   On: 01/26/2013 07:17   Dg Chest Port 1 View  01/25/2013   CLINICAL DATA:  Bilateral pleural effusions.  EXAM: PORTABLE CHEST - 1 VIEW  COMPARISON:  January 24, 2013.  FINDINGS: Cardiomediastinal silhouette appears normal. Endotracheal tube is in grossly good position with distal tip 2.5 cm above the carina. Nasogastric tube is seen passing through the esophagus into the stomach. Left internal jugular venous catheter is unchanged in position with tip in expected position of the SVC. Diffuse interstitial densities are noted throughout both lungs concerning for edema or possibly pneumonia. Focal right upper lobe airspace opacity is noted concerning for pneumonia. Probable minimal-to-mild bilateral pleural effusions are noted.  IMPRESSION: Probable minimal-to-mild bilateral pleural effusions. Endotracheal and nasogastric tubes in grossly good position. Stable diffuse interstitial densities are noted throughout both lungs concerning for edema or pneumonia. Probable right upper lobe pneumonia is again noted and unchanged.   Electronically Signed   By: Roque Lias M.D.   On: 01/25/2013 07:47   ASSESSMENT / PLAN: Principal Problem:   ARDS (adult respiratory distress syndrome) Active Problems:   Septic shock(785.52)   CAP (community acquired pneumonia)    Acute respiratory failure   Encephalopathy acute   Critical illness myopathy   Hypokalemia   PULMONARY A:  Acute respiratory failure. Aspiration pneumonia. ARDS Diuresed well   P:   Diurese further ARDS vent now at 6cc/kg = 310 cc No SBT WUA daily Adjust peep/fi2 further Needs TRach  Family conf today  CARDIOVASCULAR A:  Septic shock RESOLVED BNP 18K Diastolic dysfunction on echo EF 60% HTNive  P:  Goal MAP>60 Off vasopressors Add beta blocker   RENAL A: AKI - resolved. Hyperkalemia - resolved. Hypokalemia -  Metabolic acidosis - resolved. Hypernatremia Positive fluid balance: stay pos 13L --> 12L,>>Now NEG past 24HRS  P:   Cont lasix IV on schedule Replete K  GASTROINTESTINAL A:  Nutrition. Diarrhea still present , C diff negative. GI Px. P: TF Change PPI to H2 as PPI can contribute to diarrhea  HEMATOLOGIC A:  Leukocytosis.  Anemia of critical illness. DVT Px. P:  Trend CBC Heparin [place] PRBC for hgb < 7gm% only  INFECTIOUS A:   Presumed aspiration pneumonia, cx negative. P: ABX d/c'd , give a abx holiday   ENDOCRINE A:   No acute issues. P: SSI  NEUROLOGIC A:  Acute encephalopathy.  critical illness myopathy severe s/p NMB use and sepsis   P: Fentanyl drip, try to get off drips, add fent patch WUA daily PT consult   GOC meeting Friday 1/9.  Pt will need trach.    I have personally obtained history, examined patient, evaluated and interpreted laboratory and imaging results, reviewed medical records, formulated assessment / plan and placed orders.  CRITICAL CARE:  The patient is critically ill with multiple organ systems failure and requires high complexity decision making for assessment and support, frequent evaluation and titration of therapies, application of advanced monitoring technologies and extensive interpretation of multiple databases. Critical Care Time devoted to patient care services described in this note is  40 minutes.    Dorcas Carrow Beeper  [phone]  Cell  [phone]  If no response or cell goes to voicemail, call beeper [phone]  01/26/2013 10:00 AM

## 2013-01-26 NOTE — Progress Notes (Signed)
CRITICAL VALUE ALERT  Critical value received:  CO2-41  Date of notification:  01/26/2013  Time of notification:  0510  Critical value read back:yes  Nurse who received alert: Rita OharaLiz Mcatee   MD notified (1st page):  Dr. Herma CarsonZ  Time of first page:  0515  MD notified (2nd page):  Time of second page:  Responding MD:  Dr. Herma CarsonZ  Time MD responded:  [phone]

## 2013-01-26 NOTE — Evaluation (Signed)
Physical Therapy Evaluation Patient Details Name: Burna Cashunice Carneal MRN: [id number] DOB: 1927/12/31 Today's Date: 01/26/2013 Time: [id number] PT Time Calculation (min): 18 min  PT Assessment / Plan / Recommendation History of Present Illness  Pt adm 12/29 with severe pna and sepsis; intubated 12/30. Obstructed ETT requiring bronchoscopy 1/05. Pt on paralytics for several days. As sedation decreased, pt with + critical illness polyneurtopathy   Clinical Impression  Pt restless with spontaneous UE movements (primarily shoulder elevation, elbow flexion/supination/pronation), however did not respond to auditory (even very loud sounds) or visual stimuli (eyes open throughout, but not tracking, no blink to threat). Only response was to painful stimuli (delayed flexor withdrawal bil UEs--left in 6 seconds, right in 10 seconds; left leg did not withdraw, however after 15 sec painful stimuli and then stopped, left leg with extensor posturing (extension and internal rotation), Right leg with no response to painful stimuli).   Pt admitted with sepsis (see above hospital course). Pt currently with severe functional limitations due to the deficits listed below (see PT Problem List). Family to make decisions today re: aggressiveness of care. If family decides to proceed with aggressive care, will continue PT and establish goals. Will await their decision.    PT Assessment   (TBA after family conference)    Follow Up Recommendations       Does the patient have the potential to tolerate intense rehabilitation      Barriers to Discharge        Equipment Recommendations       Recommendations for Other Services     Frequency Min 2X/week    Precautions / Restrictions Precautions Precautions: Fall   Pertinent Vitals/Pain Vent: PRVC, FiO2 50%, PEEP 10, RR 26-28 MAP 85 No indications of pain with PROM; see above re: responses to painful stimuli      Mobility       Exercises General Exercises - Upper  Extremity Shoulder Flexion: PROM;Both;5 reps;Supine Shoulder ABduction: PROM;Both;Other reps (comment);Supine Elbow Flexion: PROM;Both;5 reps;Supine Elbow Extension: PROM;Both;5 reps;Supine Wrist Flexion: PROM;Both;Other reps (comment);Supine Wrist Extension: PROM;Both;Other reps (comment);Supine Digit Composite Flexion: PROM;Both;5 reps;Supine Composite Extension: PROM;Both;5 reps;Supine General Exercises - Lower Extremity Ankle Circles/Pumps: PROM;Both;Other reps (comment);Supine Heel Slides: PROM;Both;5 reps;Supine Hip ABduction/ADduction: PROM;Both;5 reps;Supine   PT Diagnosis: Generalized weakness  PT Problem List: Decreased strength;Decreased activity tolerance;Decreased mobility;Decreased knowledge of use of DME;Cardiopulmonary status limiting activity PT Treatment Interventions: DME instruction;Gait training;Functional mobility training;Therapeutic activities;Therapeutic exercise;Neuromuscular re-education;Cognitive remediation;Patient/family education     PT Goals(Current goals can be found in the care plan section) Acute Rehab PT Goals Patient Stated Goal: unable PT Goal Formulation:  (TBA)  Visit Information  Last PT Received On: 01/26/13 Assistance Needed: +2 (if EOB) History of Present Illness: Pt adm 12/29 with severe pna and sepsis; intubated 12/30. Obstructed ETT requiring bronchoscopy 1/05. Pt on paralytics for several days. As sedation decreased, pt with + critical illness polyneurtopathy       Prior Functioning  Home Living Living Arrangements: Alone Additional Comments: TBA; family conference at 14:00 today to discuss aggressiveness of care Prior Function Level of Independence: Independent Communication Communication: Other (comment) (intubated with ETT)    Cognition  Cognition Arousal/Alertness: Lethargic Behavior During Therapy: Flat affect Overall Cognitive Status: Difficult to assess Difficult to assess due to: Intubated;Level of arousal     Extremity/Trunk Assessment Upper Extremity Assessment Upper Extremity Assessment: RUE deficits/detail;LUE deficits/detail RUE Deficits / Details: edematous; PROM WFL except full finger flexion due to edema; pt not following commands, however noted spontaneous shoulder elevation, elbow  flexion, pronation;  RUE Sensation:  (minimal and delayed withdrawal to painful stimuli) LUE Deficits / Details: edematous; PROM WFL except full finger flexion due to edema; pt not following commands, however noted spontaneous shoulder elevation, elbow flexion, pronation, supination;  LUE Sensation:  (flexor withdrawal to pain (delayed--6 seconds) and slow) Lower Extremity Assessment Lower Extremity Assessment: RLE deficits/detail;LLE deficits/detail RLE Deficits / Details: PROM WFL; trace hip extension elicited with PROM and cues to "push leg straight"; able to hold leg in hooklying position RLE Sensation:  (no withdrawal to pain) LLE Deficits / Details: PROM WFL; no tone noted; not able to hold in hooklying position;  LLE Sensation:  (no withdrawal to pain)   Balance General Comments General comments (skin integrity, edema, etc.): Per RN, MDs goal is for a RASS score of 0 (alert) and unclear what direction family will decide to take when they meet with Palliative Care. Pt opening eyes spontaneously but not on command; moving head side to side spontaneously, however not visually tracking therapist or bright object; not turning head to loud noises  End of Session PT - End of Session Equipment Utilized During Treatment: Other (comment) (vent) Activity Tolerance: Patient tolerated treatment well Patient left: in bed  GP     01/26/2013 Veda Canning, PT Pager: [phone]    Averey Koning 01/26/2013, 11:30 AM

## 2013-01-26 NOTE — Progress Notes (Signed)
Recruitment maneuver performed x 2 minutes, due to oxygen desaturation.  Pt tolerated well and pt. returned to prior ventilator setting.

## 2013-01-26 NOTE — Progress Notes (Signed)
Thank you for consulting the [facility] to meet your patient's and family's needs.   The reason that you asked us to see your patient is  For GOC  We have scheduled your patient for a meeting: Confirmed 2 pm 1/9   The Surrogate decision make is: Daughter Terrence DupontCarol Contact information:[phone]   Other family members that need to be present:  At daughters discretion    Your patient is able/unable to participate: unable  Additional Narrative: Family expecting to talk with Dr. Delford FieldWright as offered.    Kamarii Buren L. Ladona Ridgelaylor, MD MBA The Palliative Medicine Team at [facility] Team Phone: [phone] Pager: [phone]

## 2013-01-27 ENCOUNTER — Inpatient Hospital Stay (HOSPITAL_COMMUNITY): Payer: Medicare Other

## 2013-01-27 DIAGNOSIS — J9600 Acute respiratory failure, unspecified whether with hypoxia or hypercapnia: Secondary | ICD-10-CM

## 2013-01-27 DIAGNOSIS — G9340 Encephalopathy, unspecified: Secondary | ICD-10-CM

## 2013-01-27 DIAGNOSIS — G7281 Critical illness myopathy: Secondary | ICD-10-CM

## 2013-01-27 LAB — CBC WITH DIFFERENTIAL/PLATELET
BASOS ABS: 0.1 10*3/uL (ref 0.0–0.1)
Basophils Relative: 0 % (ref 0–1)
EOS PCT: 1 % (ref 0–5)
Eosinophils Absolute: 0.2 10*3/uL (ref 0.0–0.7)
HCT: 34.3 % — ABNORMAL LOW (ref 36.0–46.0)
Hemoglobin: 10.4 g/dL — ABNORMAL LOW (ref 12.0–15.0)
LYMPHS ABS: 2.3 10*3/uL (ref 0.7–4.0)
LYMPHS PCT: 11 % — AB (ref 12–46)
MCH: 31.8 pg (ref 26.0–34.0)
MCHC: 30.3 g/dL (ref 30.0–36.0)
MCV: 104.9 fL — AB (ref 78.0–100.0)
Monocytes Absolute: 1.6 10*3/uL — ABNORMAL HIGH (ref 0.1–1.0)
Monocytes Relative: 8 % (ref 3–12)
NEUTROS PCT: 81 % — AB (ref 43–77)
Neutro Abs: 17.2 10*3/uL — ABNORMAL HIGH (ref 1.7–7.7)
PLATELETS: 438 10*3/uL — AB (ref 150–400)
RBC: 3.27 MIL/uL — ABNORMAL LOW (ref 3.87–5.11)
RDW: 15.7 % — AB (ref 11.5–15.5)
WBC: 21.4 10*3/uL — AB (ref 4.0–10.5)

## 2013-01-27 LAB — GLUCOSE, CAPILLARY
GLUCOSE-CAPILLARY: 106 mg/dL — AB (ref 70–99)
GLUCOSE-CAPILLARY: 132 mg/dL — AB (ref 70–99)
GLUCOSE-CAPILLARY: 224 mg/dL — AB (ref 70–99)
Glucose-Capillary: 117 mg/dL — ABNORMAL HIGH (ref 70–99)
Glucose-Capillary: 183 mg/dL — ABNORMAL HIGH (ref 70–99)
Glucose-Capillary: 245 mg/dL — ABNORMAL HIGH (ref 70–99)
Glucose-Capillary: 97 mg/dL (ref 70–99)

## 2013-01-27 LAB — BASIC METABOLIC PANEL
BUN: 54 mg/dL — ABNORMAL HIGH (ref 6–23)
CALCIUM: 9.7 mg/dL (ref 8.4–10.5)
CO2: >45 mEq/L (ref 19–32)
Chloride: 103 mEq/L (ref 96–112)
Creatinine, Ser: 1.18 mg/dL — ABNORMAL HIGH (ref 0.50–1.10)
GFR calc Af Amer: 48 mL/min — ABNORMAL LOW (ref >90)
GFR calc non Af Amer: 41 mL/min — ABNORMAL LOW (ref >90)
Glucose, Bld: 94 mg/dL (ref 70–99)
Potassium: 3.4 mEq/L — ABNORMAL LOW (ref 3.7–5.3)
SODIUM: 157 meq/L — AB (ref 137–147)

## 2013-01-27 MED ORDER — POTASSIUM CHLORIDE 20 MEQ/15ML (10%) PO LIQD
20.0000 meq | ORAL | Status: AC
  Administered 2013-01-27 (×2): 20 meq
  Filled 2013-01-27 (×2): qty 15

## 2013-01-27 MED ORDER — IPRATROPIUM-ALBUTEROL 0.5-2.5 (3) MG/3ML IN SOLN: 3.0000 mL | RESPIRATORY_TRACT | Status: DC | PRN

## 2013-01-27 MED ORDER — FREE WATER
250.0000 mL | Freq: Four times a day (QID) | Status: DC
  Administered 2013-01-27 – 2013-01-29 (×8): 250 mL

## 2013-01-27 NOTE — Progress Notes (Signed)
CRITICAL VALUE ALERT  Critical value received:  CO2 >45  Date of notification:  01/27/2013  Time of notification:  0520  Critical value read back:yes  Nurse who received alert:  Hinton DyerMUELLER, Ajia Chadderdon B  MD notified (1st page):  Dr. Darrick Pennaeterding  Time of first page:  0525  MD notified (2nd page):  Time of second page:  Responding MD:  Dr. Darrick Pennaeterding  Time MD responded:  [phone]

## 2013-01-27 NOTE — Progress Notes (Signed)
PULMONARY / CRITICAL CARE MEDICINE  Name: Elie Gragert MRN: [id number] DOB: [birth year]    ADMISSION DATE:  12/21/2012 CONSULTATION DATE:  12/21/2012  CHIEF COMPLAINT:  Dyspnea  BRIEF PATIENT DESCRIPTION: 80 yo transferred to [facility] with severe pneumonia, septic shock and ARDS.  SIGNIFICANT EVENTS / STUDIES: 12/29 Transfer to [facility] 01/03 LVEF 98-33%, diastolic dysfunction, mod to severe dilated RV 01/05  Bronchoscopy >>> ETT obstruction with dried secretions 01/09 Palliative care met with family 01/10 Change to pressure control  LINES / TUBES: L IJ TLC 12/29 >>> 1/9 OETT 12/30 >>> Rt PICC 1/9 >>>  CULTURES: 12/29  MRSA PCR >>> neg 12/29  Blood >>> neg 12/29  Urine >>> neg 12/29  Legionella Ag >>> neg 12/29  Pneumococcal Ag >>> neg 12/29  Influenza PCR  >>> neg 12/31  Respiratory >>> neg 1/7 C DIFF >>NEG  ANTIBIOTICS: Vancomycin 12/29 >>> 1/5 Zosyn 12/29 >>>1/8 Azithromycin 12/29 >>> 1/1  INTERVAL HISTORY:  Did not tolerate SBT >> low Vt, increase RR  VITAL SIGNS: Temp:  [98.3 F (36.8 C)-99.4 F (37.4 C)] 99.4 F (37.4 C) (01/10 0717) Pulse Rate:  [80-127] 96 (01/10 0817) Resp:  [19-45] 21 (01/10 0600) BP: (89-183)/(30-92) 91/30 mmHg (01/10 0817) SpO2:  [88 %-100 %] 99 % (01/10 0817) FiO2 (%):  [40 %-50 %] 50 % (01/10 0817) Weight:  [137 lb 12.6 oz (62.5 kg)] 137 lb 12.6 oz (62.5 kg) (01/10 0500)  HEMODYNAMICS: CVP:  [3 mmHg-11 mmHg] 11 mmHg  VENTILATOR SETTINGS: Vent Mode:  [-] PRVC FiO2 (%):  [40 %-50 %] 50 % Set Rate:  [35 bmp] 35 bmp Vt Set:  [310 mL] 310 mL PEEP:  [5 cmH20-8 cmH20] 8 cmH20 Plateau Pressure:  [19 cmH20-27 cmH20] 27 cmH20  INTAKE / OUTPUT: Intake/Output     01/09 0701 - 01/10 0700 01/10 0701 - 01/11 0700   I.V. (mL/kg) 734.5 (11.8)    NG/GT 720    Total Intake(mL/kg) 1454.5 (23.3)    Urine (mL/kg/hr) 5405 (3.6)    Stool 175 (0.1)    Total Output 5580     Net -4125.5            PHYSICAL EXAMINATION: General: ill  appearing Neuro: opens eyes spontaneously, no following commands HEENT: ETT in place Cardiovascular: regular, tachycardic Lungs: b/l rhonchi Abdomen: Soft, non tender Musculoskeletal: 2+ edema Skin:  No rash   LABS:  PULMONARY  Recent Labs Lab 01/21/13 0330 01/23/13 0332 01/25/13 1258 01/25/13 1832 01/26/13 1929  PHART 7.277* 7.271* 7.420 7.363 7.502*  PCO2ART 55.8* 66.2* 60.9* 72.0* 67.6*  PO2ART 58.0* 111.0* 71.0* 85.0 47.0*  HCO3 25.6* 29.5* 39.7* 41.0* 52.7*  TCO2 27 31.5 42 43 >50  O2SAT 82.0 98.0 94.0 95.0 83.0    CBC  Recent Labs Lab 01/25/13 0400 01/26/13 0410 01/27/13 0358  HGB 9.1* 10.1* 10.4*  HCT 29.4* 33.5* 34.3*  WBC 15.0* 18.9* 21.4*  PLT 381 415* 438*   CARDIAC    Recent Labs Lab 01/25/13 0400  TROPONINI 0.76*    Recent Labs Lab 01/25/13 0400  PROBNP 18187.0*     CHEMISTRY  Recent Labs Lab 01/21/13 0330 01/22/13 0415 01/23/13 0415 01/24/13 0340 01/25/13 0400 01/26/13 0410 01/26/13 1500 01/27/13 0358  NA 146 147 146 150*  --  155* 156* 157*  K 4.1 4.6 3.8 3.4*  --  3.0* 3.8 3.4*  CL 110 110 105 105  --  104 103 103  CO2 _0 36*  --  41* 43* >45*  GLUCOSE 131* 144* 118* 111*  --  130* 72 94  BUN 41* 42* 52* 63*  --  58* 55* 54*  CREATININE 0.95 0.94 1.18* 1.37*  --  1.21* 1.20* 1.18*  CALCIUM 8.8 9.2 9.3 9.0  --  9.2 9.5 9.7  MG 1.8 2.0  --   --  2.2  --   --   --   PHOS 3.5 4.8*  --   --  4.2  --   --   --    Estimated Creatinine Clearance: 29.9 ml/min (by C-G formula based on Cr of 1.18).   ENDOCRINE CBG (last 3)   Recent Labs  01/27/13 0002 01/27/13 0340 01/27/13 0715  GLUCAP 245* 97 117*    IMAGING x48h  Dg Chest Port 1 View  01/27/2013   CLINICAL DATA:  ARDS, ventilatory support  EXAM: PORTABLE CHEST - 1 VIEW  COMPARISON:  01/26/2013  FINDINGS: Endotracheal tube 5 cm above the carina. Right PICC line tip mid SVC level. NG tube enters the stomach with the tip not visualized. Left IJ central line  removed. Asymmetric patchy airspace process versus edema persist. This appears slightly worse in the left mid lung obscuring the left cardiac border on today's exam. No enlarging significant effusion or pneumothorax. Atherosclerosis of the aorta.  IMPRESSION: Patchy asymmetric airspace process versus ARDS, slightly worse in the left midlung. Stable support apparatus.   Electronically Signed   By: Daryll Brod M.D.   On: 01/27/2013 07:35   Dg Chest Port 1 View  01/26/2013   CLINICAL DATA:  Line placement.  EXAM: PORTABLE CHEST - 1 VIEW  COMPARISON:  January 26, 2013.  FINDINGS: Endotracheal tube is in good position with distal tip 3 cm above the carina. Nasogastric tube is seen in the proximal stomach. Left internal jugular catheter is unchanged in position with distal tip in expected position of the SVC. Interval placement of right-sided PICC line with distal tip in expected position of the SVC. Left perihilar and basilar opacity is noted concerning for pneumonia or edema. Right upper lobe opacity is also noted which is unchanged. Probable mild bilateral pleural effusions are noted.  IMPRESSION: Interval placement of right-sided PICC line with distal tip in expected position of the SVC. Other support apparatus are stable in position. Stable bilateral lung opacities.   Electronically Signed   By: Sabino Dick M.D.   On: 01/26/2013 17:29   Dg Chest Port 1 View  01/26/2013   CLINICAL DATA:  ARDS  EXAM: PORTABLE CHEST - 1 VIEW  COMPARISON:  January 25, 2013  FINDINGS: There is persistent diffuse airspace opacity throughout both lungs. Denser consolidation of the right upper to mid lung is unchanged. There probable minimal bilateral pleural effusions. Endotracheal tube, left central venous line, nasogastric tube are stable compared to prior exam. The soft tissues and osseous structures are stable.  IMPRESSION: Persistent diffuse airspace disease throughout both lungs consistent with patient's diagnosis of ARDS. Denser  consolidation in the right upper to mid lung unchanged concerning for focal pneumonia.   Electronically Signed   By: Abelardo Diesel M.D.   On: 01/26/2013 07:17   ASSESSMENT / PLAN:  PULMONARY A:  Acute respiratory failure 2nd to aspiration PNA and ARDS. P:   -change to pressure control 1/10 -f/u CXR -if family wishes to continue aggressive care, then will need trach -negative fluid balance as tolerated -change BD's to prn  CARDIOVASCULAR A:  Septic shock > resolved. Hx HTN with acute on chronic diastolic  heart failure. P:  -continue lopressor  RENAL A: AKI, Hyperkalemia, metabolic acidosis - resolved. Hypokalemia Hypernatremia P:   -free water while getting lasix -monitor renal fx, urine outpt, electrolytes  GASTROINTESTINAL A:  Protein calorie malnutrition. Diarrhea - C diff negative. P: -continue tube feeds -zantac for SUP  HEMATOLOGIC A:  Leukocytosis.  Anemia of critical illness. P:  -f/u CBC -SQ heparin for DVT prevention  INFECTIOUS A:   Aspiration PNA - completed Abx. P: -monitor clinically  ENDOCRINE A:   Hyperglycemia. P: -SSI  NEUROLOGIC A:  Acute encephalopathy 2nd to sepsis, AKI, respiratory failure. 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  If no further improvement, then family may consider comfort measures.  CC time 35 minutes.  Chesley Mires, MD [facility] Pulmonary/Critical Care 01/27/2013, 9:03 AM Pager:  [phone] After 3pm call: [phone]

## 2013-01-27 NOTE — Progress Notes (Signed)
 650mg  Tylenol given via tube for increased temp.  Blankets removed, prevalon boots and scds removed, cool rag placed on forehead and room temp decreased.  Will continue to monitor.

## 2013-01-28 ENCOUNTER — Inpatient Hospital Stay (HOSPITAL_COMMUNITY): Payer: Medicare Other

## 2013-01-28 DIAGNOSIS — J189 Pneumonia, unspecified organism: Secondary | ICD-10-CM

## 2013-01-28 LAB — GLUCOSE, CAPILLARY
GLUCOSE-CAPILLARY: 163 mg/dL — AB (ref 70–99)
GLUCOSE-CAPILLARY: 192 mg/dL — AB (ref 70–99)
Glucose-Capillary: 106 mg/dL — ABNORMAL HIGH (ref 70–99)
Glucose-Capillary: 113 mg/dL — ABNORMAL HIGH (ref 70–99)
Glucose-Capillary: 149 mg/dL — ABNORMAL HIGH (ref 70–99)
Glucose-Capillary: 151 mg/dL — ABNORMAL HIGH (ref 70–99)
Glucose-Capillary: 82 mg/dL (ref 70–99)

## 2013-01-28 LAB — CBC
HCT: 31.1 % — ABNORMAL LOW (ref 36.0–46.0)
Hemoglobin: 9.3 g/dL — ABNORMAL LOW (ref 12.0–15.0)
MCH: 31.3 pg (ref 26.0–34.0)
MCHC: 29.9 g/dL — AB (ref 30.0–36.0)
MCV: 104.7 fL — ABNORMAL HIGH (ref 78.0–100.0)
PLATELETS: 371 10*3/uL (ref 150–400)
RBC: 2.97 MIL/uL — AB (ref 3.87–5.11)
RDW: 16.1 % — ABNORMAL HIGH (ref 11.5–15.5)
WBC: 22.1 10*3/uL — ABNORMAL HIGH (ref 4.0–10.5)

## 2013-01-28 LAB — BASIC METABOLIC PANEL
BUN: 61 mg/dL — ABNORMAL HIGH (ref 6–23)
CALCIUM: 9 mg/dL (ref 8.4–10.5)
CO2: 44 mEq/L (ref 19–32)
Chloride: 103 mEq/L (ref 96–112)
Creatinine, Ser: 1.42 mg/dL — ABNORMAL HIGH (ref 0.50–1.10)
GFR calc Af Amer: 38 mL/min — ABNORMAL LOW (ref >90)
GFR, EST NON AFRICAN AMERICAN: 33 mL/min — AB (ref >90)
Glucose, Bld: 148 mg/dL — ABNORMAL HIGH (ref 70–99)
Potassium: 3.4 mEq/L — ABNORMAL LOW (ref 3.7–5.3)
SODIUM: 155 meq/L — AB (ref 137–147)

## 2013-01-28 MED ORDER — POTASSIUM CHLORIDE 20 MEQ/15ML (10%) PO LIQD
40.0000 meq | Freq: Once | ORAL | Status: AC
  Administered 2013-01-28: 40 meq
  Filled 2013-01-28: qty 30

## 2013-01-28 NOTE — Progress Notes (Signed)
eLink Physician-Brief Progress Note Patient Name: Briana Miranda DOB: October 14, 1927 MRN: [id number]  Date of Service  01/28/2013   HPI/Events of Note  Hypokalemia  eICU Interventions  Potassium replaced   Intervention Category Minor Interventions: Electrolytes abnormality - evaluation and management  DETERDING,ELIZABETH 01/28/2013, 6:03 AM

## 2013-01-28 NOTE — Progress Notes (Signed)
CRITICAL VALUE ALERT  Critical value received:  CO2 44 Date of notification:  01/29/12  Time of notification:  0603  Critical value read back:yes  Nurse who received alert:  Ernie HewJohn Perrin, RN  Result consistent with previous values.

## 2013-01-28 NOTE — Progress Notes (Signed)
PULMONARY / CRITICAL CARE MEDICINE  Name: Briana Miranda MRN: [id number] DOB: 20-Oct-1927    ADMISSION DATE:  01/16/2013 CONSULTATION DATE:  01/14/2013  CHIEF COMPLAINT:  Dyspnea  BRIEF PATIENT DESCRIPTION: 80 yo transferred to [facility] with severe pneumonia, septic shock and ARDS.  SIGNIFICANT EVENTS / STUDIES: 12/29 Transfer to [facility] 01/03 LVEF 22-97%, diastolic dysfunction, mod to severe dilated RV 01/05  Bronchoscopy >>> ETT obstruction with dried secretions 01/09 Palliative care met with family 01/10 Change to pressure control  LINES / TUBES: L IJ TLC 12/29 >>> 1/9 OETT 12/30 >>> Rt PICC 1/9 >>>  CULTURES: 12/29  MRSA PCR >>> neg 12/29  Blood >>> neg 12/29  Urine >>> neg 12/29  Legionella Ag >>> neg 12/29  Pneumococcal Ag >>> neg 12/29  Influenza PCR  >>> neg 12/31  Respiratory >>> neg 1/7 C DIFF >>NEG  ANTIBIOTICS: Vancomycin 12/29 >>> 1/5 Zosyn 12/29 >>>1/8 Azithromycin 12/29 >>> 1/1  INTERVAL HISTORY:  Did not tolerate SBT >> low Vt, increase RR.  VITAL SIGNS: Temp:  [98.3 F (36.8 C)-101.8 F (38.8 C)] 99.8 F (37.7 C) (01/11 0721) Pulse Rate:  [90-117] 100 (01/11 0900) Resp:  [18-36] 22 (01/11 0900) BP: (82-162)/(25-75) 89/34 mmHg (01/11 0900) SpO2:  [88 %-99 %] 92 % (01/11 0900) FiO2 (%):  [40 %-50 %] 50 % (01/11 0815) Weight:  [140 lb 3.4 oz (63.6 kg)] 140 lb 3.4 oz (63.6 kg) (01/11 0500)  HEMODYNAMICS: CVP:  [4 mmHg-11 mmHg] 5 mmHg  VENTILATOR SETTINGS: Vent Mode:  [-] PCV FiO2 (%):  [40 %-50 %] 50 % Set Rate:  [22 bmp] 22 bmp PEEP:  [5 cmH20] 5 cmH20 Plateau Pressure:  [19 cmH20-28 cmH20] 26 cmH20  INTAKE / OUTPUT: Intake/Output     01/10 0701 - 01/11 0700 01/11 0701 - 01/12 0700   I.V. (mL/kg) 626.5 (9.9) 26.5 (0.4)   NG/GT 2130 30   Total Intake(mL/kg) 2756.5 (43.3) 56.5 (0.9)   Urine (mL/kg/hr) 1400 (0.9)    Stool 475 (0.3)    Total Output 1875     Net +881.5 +56.5          PHYSICAL EXAMINATION: General: ill appearing Neuro: opens  eyes spontaneously, no following commands HEENT: ETT in place Cardiovascular: regular, tachycardic Lungs: b/l rhonchi Abdomen: Soft, non tender Musculoskeletal: 2+ edema Skin:  No rash   LABS:  PULMONARY  Recent Labs Lab 01/23/13 0332 01/25/13 1258 01/25/13 1832 01/26/13 1929  PHART 7.271* 7.420 7.363 7.502*  PCO2ART 66.2* 60.9* 72.0* 67.6*  PO2ART 111.0* 71.0* 85.0 47.0*  HCO3 29.5* 39.7* 41.0* 52.7*  TCO2 31.5 42 43 >50  O2SAT 98.0 94.0 95.0 83.0    CBC  Recent Labs Lab 01/26/13 0410 01/27/13 0358 01/28/13 0457  HGB 10.1* 10.4* 9.3*  HCT 33.5* 34.3* 31.1*  WBC 18.9* 21.4* 22.1*  PLT 415* 438* 371   CARDIAC    Recent Labs Lab 01/25/13 0400  TROPONINI 0.76*    Recent Labs Lab 01/25/13 0400  PROBNP 18187.0*     CHEMISTRY  Recent Labs Lab 01/22/13 0415  01/24/13 0340 01/25/13 0400 01/26/13 0410 01/26/13 1500 01/27/13 0358 01/28/13 0457  NA 147  < > 150*  --  155* 156* 157* 155*  K 4.6  < > 3.4*  --  3.0* 3.8 3.4* 3.4*  CL 110  < > 105  --  104 103 103 103  CO2 28  < > 36*  --  41* 43* >45* 44*  GLUCOSE 144*  < > 111*  --  130* 72 94 148*  BUN 42*  < > 63*  --  58* 55* 54* 61*  CREATININE 0.94  < > 1.37*  --  1.21* 1.20* 1.18* 1.42*  CALCIUM 9.2  < > 9.0  --  9.2 9.5 9.7 9.0  MG 2.0  --   --  2.2  --   --   --   --   PHOS 4.8*  --   --  4.2  --   --   --   --   < > = values in this interval not displayed. Estimated Creatinine Clearance: 27 ml/min (by C-G formula based on Cr of 1.42).   ENDOCRINE CBG (last 3)   Recent Labs  01/27/13 1947 01/27/13 2325 01/28/13 0719  GLUCAP 106* 163* 106*    IMAGING x48h  Dg Chest Port 1 View  01/28/2013   CLINICAL DATA:  Respiratory failure.  EXAM: PORTABLE CHEST - 1 VIEW  COMPARISON:  01/27/2013  FINDINGS: Endotracheal tube remains with the tip approximately 4 cm above the carina. Lungs show stable pneumonia and airspace infiltrates in a multilobar distribution bilaterally. No significant  pleural effusions. No pneumothorax.  IMPRESSION: Stable bilateral pulmonary infiltrates.   Electronically Signed   By: Aletta Edouard M.D.   On: 01/28/2013 08:46   Dg Chest Port 1 View  01/27/2013   CLINICAL DATA:  ARDS, ventilatory support  EXAM: PORTABLE CHEST - 1 VIEW  COMPARISON:  01/26/2013  FINDINGS: Endotracheal tube 5 cm above the carina. Right PICC line tip mid SVC level. NG tube enters the stomach with the tip not visualized. Left IJ central line removed. Asymmetric patchy airspace process versus edema persist. This appears slightly worse in the left mid lung obscuring the left cardiac border on today's exam. No enlarging significant effusion or pneumothorax. Atherosclerosis of the aorta.  IMPRESSION: Patchy asymmetric airspace process versus ARDS, slightly worse in the left midlung. Stable support apparatus.   Electronically Signed   By: Daryll Brod M.D.   On: 01/27/2013 07:35   Dg Chest Port 1 View  01/26/2013   CLINICAL DATA:  Line placement.  EXAM: PORTABLE CHEST - 1 VIEW  COMPARISON:  January 26, 2013.  FINDINGS: Endotracheal tube is in good position with distal tip 3 cm above the carina. Nasogastric tube is seen in the proximal stomach. Left internal jugular catheter is unchanged in position with distal tip in expected position of the SVC. Interval placement of right-sided PICC line with distal tip in expected position of the SVC. Left perihilar and basilar opacity is noted concerning for pneumonia or edema. Right upper lobe opacity is also noted which is unchanged. Probable mild bilateral pleural effusions are noted.  IMPRESSION: Interval placement of right-sided PICC line with distal tip in expected position of the SVC. Other support apparatus are stable in position. Stable bilateral lung opacities.   Electronically Signed   By: Sabino Dick M.D.   On: 01/26/2013 17:29   ASSESSMENT / PLAN:  PULMONARY A:  Acute respiratory failure 2nd to aspiration PNA and ARDS. P:   -changed to  pressure control 1/10 -f/u CXR intermittently -if family wishes to continue aggressive care, then will need trach -negative fluid balance as tolerated -change BD's to prn  CARDIOVASCULAR A:  Septic shock > resolved. Hx HTN with acute on chronic diastolic heart failure. P:  -continue lopressor  RENAL A: AKI, Hyperkalemia, metabolic acidosis - resolved. Hypokalemia Hypernatremia P:   -free water while getting intermittent lasix -monitor renal fx, urine  outpt, electrolytes  GASTROINTESTINAL A:  Protein calorie malnutrition. Diarrhea - C diff negative. P: -continue tube feeds -zantac for SUP  HEMATOLOGIC A:  Leukocytosis.  Anemia of critical illness. P:  -f/u CBC -SQ heparin for DVT prevention  INFECTIOUS A:   Aspiration PNA - completed Abx. P: -monitor clinically  ENDOCRINE A:   Hyperglycemia. P: -SSI  NEUROLOGIC A:  Acute encephalopathy 2nd to sepsis, AKI, respiratory failure. 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  If no further improvement, then family may consider comfort measures.  Summary: No significant improvement in respiratory status or mental status.  Will need to d/w family further about goals of care.  CC time 35 minutes.  Chesley Mires, MD [facility] Pulmonary/Critical Care 01/28/2013, 9:16 AM Pager:  [phone] After 3pm call: [phone]

## 2013-01-29 ENCOUNTER — Inpatient Hospital Stay (HOSPITAL_COMMUNITY): Payer: Medicare Other

## 2013-01-29 DIAGNOSIS — Z515 Encounter for palliative care: Secondary | ICD-10-CM

## 2013-01-29 DIAGNOSIS — R5383 Other fatigue: Secondary | ICD-10-CM

## 2013-01-29 DIAGNOSIS — R5381 Other malaise: Secondary | ICD-10-CM

## 2013-01-29 LAB — GLUCOSE, CAPILLARY
GLUCOSE-CAPILLARY: 112 mg/dL — AB (ref 70–99)
GLUCOSE-CAPILLARY: 125 mg/dL — AB (ref 70–99)

## 2013-01-29 MED ORDER — MIDAZOLAM BOLUS VIA INFUSION
5.0000 mg | INTRAVENOUS | Status: DC | PRN
  Administered 2013-01-29: 5 mg via INTRAVENOUS
  Administered 2013-01-29 (×2): 3 mg via INTRAVENOUS
  Administered 2013-01-29: 1 mg via INTRAVENOUS
  Administered 2013-01-29: 5 mg via INTRAVENOUS
  Administered 2013-01-29: 2 mg via INTRAVENOUS
  Administered 2013-01-29: 3 mg via INTRAVENOUS
  Filled 2013-01-29: qty 20

## 2013-01-29 MED ORDER — POTASSIUM CHLORIDE 20 MEQ/15ML (10%) PO LIQD
40.0000 meq | Freq: Once | ORAL | Status: DC
  Filled 2013-01-29: qty 30

## 2013-01-29 MED ORDER — DEXTROSE 5 % IV SOLN
INTRAVENOUS | Status: DC
  Administered 2013-01-29: 11:00:00 via INTRAVENOUS

## 2013-01-29 MED ORDER — SODIUM CHLORIDE 0.9 % IV SOLN
10.0000 mg/h | INTRAVENOUS | Status: DC
  Administered 2013-01-29: 1 mg/h via INTRAVENOUS
  Administered 2013-01-29: 12.5 mg/h via INTRAVENOUS
  Filled 2013-01-29 (×3): qty 10

## 2013-01-29 MED ORDER — MORPHINE SULFATE 10 MG/ML IJ SOLN
10.0000 mg/h | INTRAVENOUS | Status: DC
  Filled 2013-01-29: qty 10

## 2013-01-29 MED ORDER — SODIUM CHLORIDE 0.9 % IV SOLN
1.0000 mg/h | INTRAVENOUS | Status: DC
  Administered 2013-01-29: 30 mg/h via INTRAVENOUS
  Administered 2013-01-29: 25 mg/h via INTRAVENOUS
  Filled 2013-01-29 (×3): qty 25

## 2013-01-29 MED ORDER — MORPHINE BOLUS VIA INFUSION
5.0000 mg | INTRAVENOUS | Status: DC | PRN
  Administered 2013-01-29: 12.5 mg via INTRAVENOUS
  Administered 2013-01-29: 20 mg via INTRAVENOUS
  Administered 2013-01-29: 15 mg via INTRAVENOUS
  Administered 2013-01-29: 10 mg via INTRAVENOUS
  Administered 2013-01-29: 18.5 mg via INTRAVENOUS
  Administered 2013-01-29 (×4): 20 mg via INTRAVENOUS
  Filled 2013-01-29: qty 20

## 2013-01-29 MED ORDER — SODIUM CHLORIDE 0.9 % IV SOLN
10.0000 mg/h | INTRAVENOUS | Status: DC
  Administered 2013-01-29: 10 mg/h via INTRAVENOUS
  Filled 2013-01-29: qty 10

## 2013-02-05 NOTE — Discharge Summary (Signed)
NAME:  Briana Miranda, [name]              ACCOUNT NO.:  [id number]  MEDICAL RECORD NO.:  [id number]  LOCATION:                               FACILITY:  [facility]  PHYSICIAN:  Nelda Bucksaniel J Tymira Horkey, MD DATE OF BIRTH:  12-01-1927  DATE OF ADMISSION:  12/24/2012 DATE OF DISCHARGE:  01/06/2013                              DISCHARGE SUMMARY   DEATH SUMMARY:  HISTORY:  This is a 80 year old patient [facility] with severe pneumonia, septic shock, and ARDS.  The patient's significant events during the hospitalization included on 12/27/2012, being [facility].  I had an echo on January 3, showed ejection fraction 60-65% with diastolic heart disease, moderate-to-severe dilated RV.  On January 22, 2013, the patient underwent bronchoscopy with endotracheal tube showing obstruction with dried secretions.  On January 26, 2013, the patient had palliative meeting with the family.  On January 27, 2013, the patient continued to fail requiring pressure control ventilation.  Cultures essentially were negative including legionnaires and pneumococcal antigen as well as PCR with a flu.  C. difficile was also negative.  The patient was treated aggressively with antibiotics including azithromycin, vancomycin, and Zosyn.  The patient continued to wean very, very ineffectively during hospitalization secondary to pneumonia and ARDS.  The patient remained on the ventilator for 2 weeks ago with lack of progress.  Discussion was made with the family, which showed opted for comfort care.  The patient was discontinued from life support and expired.  FINAL DIAGNOSES UPON DEATH: 1. Pneumonia. 2. Adult respiratory distress syndrome. 3. Acute respiratory failure. 4. Septic shock.     Nelda Bucksaniel J Navaya Wiatrek, MD     DJF/MEDQ  D:  02/01/2013  T:  02/02/2013  Job:  [id number]

## 2013-02-18 NOTE — Progress Notes (Addendum)
60 mL witnessed WIS from Fentanyl bag. Witnessed by Roselie AwkwardShannon Lexee Brashears, RN and Ulyess Blossomarcy Dennison-Harwood, RN.

## 2013-02-18 NOTE — Procedures (Signed)
Extubation Procedure Note  Patient Details:   Name: Briana Miranda DOB: 04/05/1927 MRN: [id number]   Pt extubated for Comfort Care measures.  Family at bedside.  Pt had cuff leak and placed on 4LNC.    Evaluation  O2 sats: stable throughout Complications: No apparent complications Patient did tolerate procedure well. Bilateral Breath Sounds: Rhonchi   Keilyn Nadal Apple 01/24/2013, 3:58 PM

## 2013-02-18 NOTE — Progress Notes (Signed)
Progress Note from the Palliative Medicine Team at [facility]  Subjective: patient remains intubated, with no significant improvement over the week-end  -family gathered for continued conversation regarding plan of care and options  decision today is for liberation from ventilator and symptoms management, "let nature take its course"    Objective: No Known Allergies Scheduled Meds: . acidophilus  1 capsule Oral Daily  . antiseptic oral rinse  1 application Mouth Rinse QID  . chlorhexidine  15 mL Mouth/Throat BID  . feeding supplement (OXEPA)  1,000 mL Per Tube Q24H  . feeding supplement (PRO-STAT SUGAR FREE 64)  30 mL Per Tube TID  . free water  250 mL Per Tube Q6H  . heparin  5,000 Units Subcutaneous Q8H  . insulin aspart  2-6 Units Subcutaneous Q4H  . metoprolol tartrate  100 mg Per Tube BID  . multivitamin  5 mL Per Tube Daily  . ranitidine  150 mg Per Tube BID  . sodium chloride  10-40 mL Intracatheter Q12H   Continuous Infusions: . fentaNYL infusion INTRAVENOUS 175 mcg/hr (02-15-13 0800)   PRN Meds:.sodium chloride, acetaminophen (TYLENOL) oral liquid 160 mg/5 mL, fentaNYL, hydrALAZINE, ipratropium-albuterol, midazolam, sodium chloride  BP 132/55  Pulse 82  Temp(Src) 98.1 F (36.7 C) (Oral)  Resp 22  Ht 5\' 3"  (1.6 m)  Wt 63.5 kg (139 lb 15.9 oz)  BMI 24.80 kg/m2  SpO2 96%   PPS:20 % at best    Intake/Output Summary (Last 24 hours) at 2013/02/15 0904 Last data filed at 02-15-13 0800  Gross per 24 hour  Intake   1945 ml  Output   1840 ml  Net    105 ml       Physical Exam:  General: ill appearing, remains intubated, unable to follow commands HEENT:  Mm, noted ET tube Chest:   Equal air movement, scattered coarse BS CVS: RRR Abdomen:soft NT +BS Ext: cool, without edema Neuro:unalbe to follow commands  Labs: CBC    Component Value Date/Time   WBC 22.1* 01/28/2013 0457   RBC 2.97* 01/28/2013 0457   HGB 9.3* 01/28/2013 0457   HCT 31.1* 01/28/2013 0457    PLT 371 01/28/2013 0457   MCV 104.7* 01/28/2013 0457   MCH 31.3 01/28/2013 0457   MCHC 29.9* 01/28/2013 0457   RDW 16.1* 01/28/2013 0457   LYMPHSABS 2.3 01/27/2013 0358   MONOABS 1.6* 01/27/2013 0358   EOSABS 0.2 01/27/2013 0358   BASOSABS 0.1 01/27/2013 0358    BMET    Component Value Date/Time   NA 155* 01/28/2013 0457   K 3.4* 01/28/2013 0457   CL 103 01/28/2013 0457   CO2 44* 01/28/2013 0457   GLUCOSE 148* 01/28/2013 0457   BUN 61* 01/28/2013 0457   CREATININE 1.42* 01/28/2013 0457   CALCIUM 9.0 01/28/2013 0457   GFRNONAA 33* 01/28/2013 0457   GFRAA 38* 01/28/2013 0457    CMP     Component Value Date/Time   NA 155* 01/28/2013 0457   K 3.4* 01/28/2013 0457   CL 103 01/28/2013 0457   CO2 44* 01/28/2013 0457   GLUCOSE 148* 01/28/2013 0457   BUN 61* 01/28/2013 0457   CREATININE 1.42* 01/28/2013 0457   CALCIUM 9.0 01/28/2013 0457   PROT 6.3 01/08/2013 0620   ALBUMIN 1.9* 01/08/2013 0620   AST 45* 01/11/2013 0620   ALT 18 01/05/2013 0620   ALKPHOS 173* 12/19/2012 0620   BILITOT 0.5 01/17/2013 0620   GFRNONAA 33* 01/28/2013 0457   GFRAA 38* 01/28/2013 0457  Assessment and Plan: 1. Code Status: DNR/DNI-comfort is main fcosu of care 2. Symptom Control:         Dyspnea/Pain: Morphine and Ativan gtt initiated by CCM  3. Psycho/Social:  Emotional support offered to family.  All at at peace with decision for liberation from ventilator.  They believe they are honoring the patient by doing so. 4. Spiritual   Strong community church support 5. Disposition: prognosis is likely hrs, expect a hospital death  Patient Documents Completed or Given: Document Given Completed  Advanced Directives Pkt    MOST yes   DNR    Gone from My Sight yes   Hard Choices yes     Time In Time Out Total Time Spent with Patient Total Overall Time  0845 0945 60 min 65 min    Greater than 50%  of this time was spent counseling and coordinating care related to the above assessment and plan.  Lorinda CreedMary Lorren Rossetti NP   Palliative Medicine Team Team Phone # [phone] Pager [phone]  Discussed with Dr Doristine MangoFinstein 1

## 2013-02-18 NOTE — Progress Notes (Signed)
Nutrition Brief Note  Chart reviewed. Pt now transitioning to comfort care.  No further nutrition interventions warranted at this time.  Please re-consult as needed.   Katie Orestes Geiman, RD, LDN Pager #: [phone] After-Hours Pager #: [phone]    

## 2013-02-18 NOTE — Progress Notes (Signed)
35 mL of morphine gtt wasted in sink. Wittnessed by Linton FlemingsSamantha Hardy, RN. Ron AgeeElizabeth Caylynn Minchew, RN

## 2013-02-18 NOTE — Progress Notes (Signed)
Chaplain was requested by nursing staff for family support.  Pt prognosis is grimm and is not expected to recover.  Pt is being transitioned to comfort care after a brief illness and decline.  The large family is very supportive of one another and are processing the life changes of the pt is healthy ways.  The pt is sedated and intubated at this time and the staff are beginning the medical transitions.  Chaplain will continue to support the family as the transition is completed.   05/07/13 1200  Clinical Encounter Type  Visited With Patient and family together  Visit Type Initial;Psychological support;Spiritual support;Patient actively dying  Referral From Nurse  Spiritual Encounters  Spiritual Needs Prayer;Emotional;Grief support  Stress Factors  Patient Stress Factors None identified  Family Stress Factors Health changes;Major life changes   [place]ia Jachin Coury, [address] Hospital [address]haplain

## 2013-02-18 NOTE — Progress Notes (Signed)
Pt extubated per terminal extubation guidelines with RT. Family with chaplain at bedside. Will continue to give emotional support and monitor pt for gtt titration.

## 2013-02-18 NOTE — Progress Notes (Signed)
Extensive discussion with family daughter, sister. We discussed the poor prognosis and likely poor quality of life. Family has decided to offer full comfort care. They are aware that the patient may be transferred to palliative care floor for continued comfort care needs. They have been fully updated on the process and expectations.  Mcarthur Rossettianiel J. Tyson AliasFeinstein, MD, FACP Pgr: [phone] [place] Pul[facility] & Critical [facility]

## 2013-02-18 NOTE — Progress Notes (Addendum)
Pt apneic and absent pulses  At 18:55. On auscultation no heart tones and no resperations were heard. Verified by Ulyess Blossomarcy Dennison-Harwood, RN and Roselie AwkwardShannon Halona Amstutz, RN.

## 2013-02-18 NOTE — Progress Notes (Signed)
PULMONARY / CRITICAL CARE MEDICINE  Name: Aarohi Redditt MRN: [id number] DOB: 1927/04/04    ADMISSION DATE:  01/09/2013 CONSULTATION DATE:  12/28/2012  CHIEF COMPLAINT:  Dyspnea  BRIEF PATIENT DESCRIPTION: 80 yo transferred to [facility] with severe pneumonia, septic shock and ARDS.  SIGNIFICANT EVENTS / STUDIES: 12/29 Transfer to [facility] 01/03 LVEF 45-03%, diastolic dysfunction, mod to severe dilated RV 01/05  Bronchoscopy >>> ETT obstruction with dried secretions 01/09 Palliative care met with family 01/10 Change to pressure control  LINES / TUBES: L IJ TLC 12/29 >>> 1/9 OETT 12/30 >>> Rt PICC 1/9 >>>  CULTURES: 12/29  MRSA PCR >>> neg 12/29  Blood >>> neg 12/29  Urine >>> neg 12/29  Legionella Ag >>> neg 12/29  Pneumococcal Ag >>> neg 12/29  Influenza PCR  >>> neg 12/31  Respiratory >>> neg 1/7 C DIFF >>NEG  ANTIBIOTICS: Vancomycin 12/29 >>> 1/5 Zosyn 12/29 >>>1/8 Azithromycin 12/29 >>> 1/1  INTERVAL HISTORY:  Remains weak, poor weaning  VITAL SIGNS: Temp:  [97.8 F (36.6 C)-99.3 F (37.4 C)] 98.1 F (36.7 C) (01/12 0758) Pulse Rate:  [59-95] 82 (01/12 0800) Resp:  [16-25] 22 (01/12 0800) BP: (83-135)/(29-65) 132/55 mmHg (01/12 0800) SpO2:  [69 %-100 %] 96 % (01/12 0800) FiO2 (%):  [50 %-60 %] 50 % (01/12 0758) Weight:  [63.5 kg (139 lb 15.9 oz)] 63.5 kg (139 lb 15.9 oz) (01/12 0800)  HEMODYNAMICS: CVP:  [5 mmHg-18 mmHg] 6 mmHg  VENTILATOR SETTINGS: Vent Mode:  [-] PCV FiO2 (%):  [50 %-60 %] 50 % Set Rate:  [22 bmp] 22 bmp PEEP:  [5 cmH20] 5 cmH20 Plateau Pressure:  [25 cmH20-31 cmH20] 31 cmH20  INTAKE / OUTPUT: Intake/Output     01/11 0701 - 01/12 0700 01/12 0701 - 01/13 0700   I.V. (mL/kg) 414 (6.5) 37.5 (0.6)   NG/GT 1540 60   Total Intake(mL/kg) 1954 (30.7) 97.5 (1.5)   Urine (mL/kg/hr) 1540 (1) 200 (1.5)   Stool 200 (0.1) 100 (0.7)   Total Output 1740 300   Net +214 -202.5          PHYSICAL EXAMINATION: General: ill appearing Neuro: opens eyes  spontaneously, no following commands, moves upper ext HEENT: ETT in place Cardiovascular: s1 s2  regular, tachycardic Lungs: b/l rhonchi diffuse Abdomen: Soft, non tender Musculoskeletal: reduced 2+ edema Skin:  No rash   LABS:  PULMONARY  Recent Labs Lab 01/23/13 0332 01/25/13 1258 01/25/13 1832 01/26/13 1929  PHART 7.271* 7.420 7.363 7.502*  PCO2ART 66.2* 60.9* 72.0* 67.6*  PO2ART 111.0* 71.0* 85.0 47.0*  HCO3 29.5* 39.7* 41.0* 52.7*  TCO2 31.5 42 43 >50  O2SAT 98.0 94.0 95.0 83.0    CBC  Recent Labs Lab 01/26/13 0410 01/27/13 0358 01/28/13 0457  HGB 10.1* 10.4* 9.3*  HCT 33.5* 34.3* 31.1*  WBC 18.9* 21.4* 22.1*  PLT 415* 438* 371   CARDIAC    Recent Labs Lab 01/25/13 0400  TROPONINI 0.76*    Recent Labs Lab 01/25/13 0400  PROBNP 18187.0*     CHEMISTRY  Recent Labs Lab 01/24/13 0340 01/25/13 0400 01/26/13 0410 01/26/13 1500 01/27/13 0358 01/28/13 0457  NA 150*  --  155* 156* 157* 155*  K 3.4*  --  3.0* 3.8 3.4* 3.4*  CL 105  --  104 103 103 103  CO2 36*  --  41* 43* >45* 44*  GLUCOSE 111*  --  130* 72 94 148*  BUN 63*  --  58* 55* 54* 61*  CREATININE 1.37*  --  1.21* 1.20* 1.18* 1.42*  CALCIUM 9.0  --  9.2 9.5 9.7 9.0  MG  --  2.2  --   --   --   --   PHOS  --  4.2  --   --   --   --    Estimated Creatinine Clearance: 26.9 ml/min (by C-G formula based on Cr of 1.42).   ENDOCRINE CBG (last 3)   Recent Labs  01/28/13 2338 02-02-2013 0446 02-02-13 0756  GLUCAP 151* 112* 125*    IMAGING x48h  Dg Chest Port 1 View  02-Feb-2013   CLINICAL DATA:  80 year old female with respiratory failure. Intubated. Community acquired pneumonia. Septic shock. Initial encounter.  EXAM: PORTABLE CHEST - 1 VIEW  COMPARISON:  01/28/2013 and earlier.  FINDINGS: Portable AP semi upright view at 0600 hr. Stable endotracheal tube tip between the level the clavicles and carina. Enteric tube courses to the left upper quadrant. Stable right PICC line.  Stable  lung volumes. Stable cardiac size and mediastinal contours. No significant change in the right upper lobe and left multilobar nodular airspace opacity. Superimposed bibasilar streaky opacity. No pneumothorax. No large effusion.  IMPRESSION: 1.  Stable lines and tubes. 2. Ventilation not significantly changed, left greater than right multilobar airspace disease compatible with bilateral pneumonia.   Electronically Signed   By: Lars Pinks M.D.   On: 2013/02/02 08:02   Dg Chest Port 1 View  01/28/2013   CLINICAL DATA:  Respiratory failure.  EXAM: PORTABLE CHEST - 1 VIEW  COMPARISON:  01/27/2013  FINDINGS: Endotracheal tube remains with the tip approximately 4 cm above the carina. Lungs show stable pneumonia and airspace infiltrates in a multilobar distribution bilaterally. No significant pleural effusions. No pneumothorax.  IMPRESSION: Stable bilateral pulmonary infiltrates.   Electronically Signed   By: Aletta Edouard M.D.   On: 01/28/2013 08:46   ASSESSMENT / PLAN:  PULMONARY A:  Acute respiratory failure 2nd to aspiration PNA and ARDS. P:   -changed to pressure control 1/10, remains on PC 25 above 5 -limit BDers in ards -wean attempt this am cpap 5 ps 5, goal 2 hrs -limit pos balance -consider comfort, near 14 days vent, lack of progress, age a factor and severe debillitation -last ABG 7.50, reduce MV likely needed, if continues support -willl abg   CARDIOVASCULAR A:  Septic shock > resolved. Hx HTN with acute on chronic diastolic heart failure. P:  -continue lopressor  RENAL A: AKI, Hyperkalemia, metabolic acidosis - resolved. Hypokalemia Hypernatremia P:   -free water while getting intermittent lasix -add d5w, chem in am  -K supp  GASTROINTESTINAL A:  Protein calorie malnutrition. Diarrhea - C diff negative. P: -continue tube feeds, hold for comfort if planned -zantac for SUP  HEMATOLOGIC A:  Leukocytosis.  Anemia of critical illness. P:  -SQ heparin for DVT  prevention  INFECTIOUS A:   Aspiration PNA - completed Abx. P: -monitor clinically for temps -wbc may drop with volume  ENDOCRINE A:   Hyperglycemia. P: -SSI  NEUROLOGIC A:  Acute encephalopathy 2nd to sepsis, AKI, respiratory failure. 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  If no further improvement, then family may consider comfort measures.  Summary: No significant improvement in respiratory status or mental status.  About to meet with family, consider comfort. Wean attempt now  CC time 30 minutes.  Quillian Quince  J. Titus Mould, MD, [place] Pgr: [place] [facility]

## 2013-02-18 DEATH — deceased

## 2014-07-12 IMAGING — CR DG CHEST 1V PORT
1 series · 1 of 1 positions shown · non-contrast
Comparison: 01/26/2013

CLINICAL DATA: ARDS, ventilatory support

EXAM:
PORTABLE CHEST - 1 VIEW

[AP]
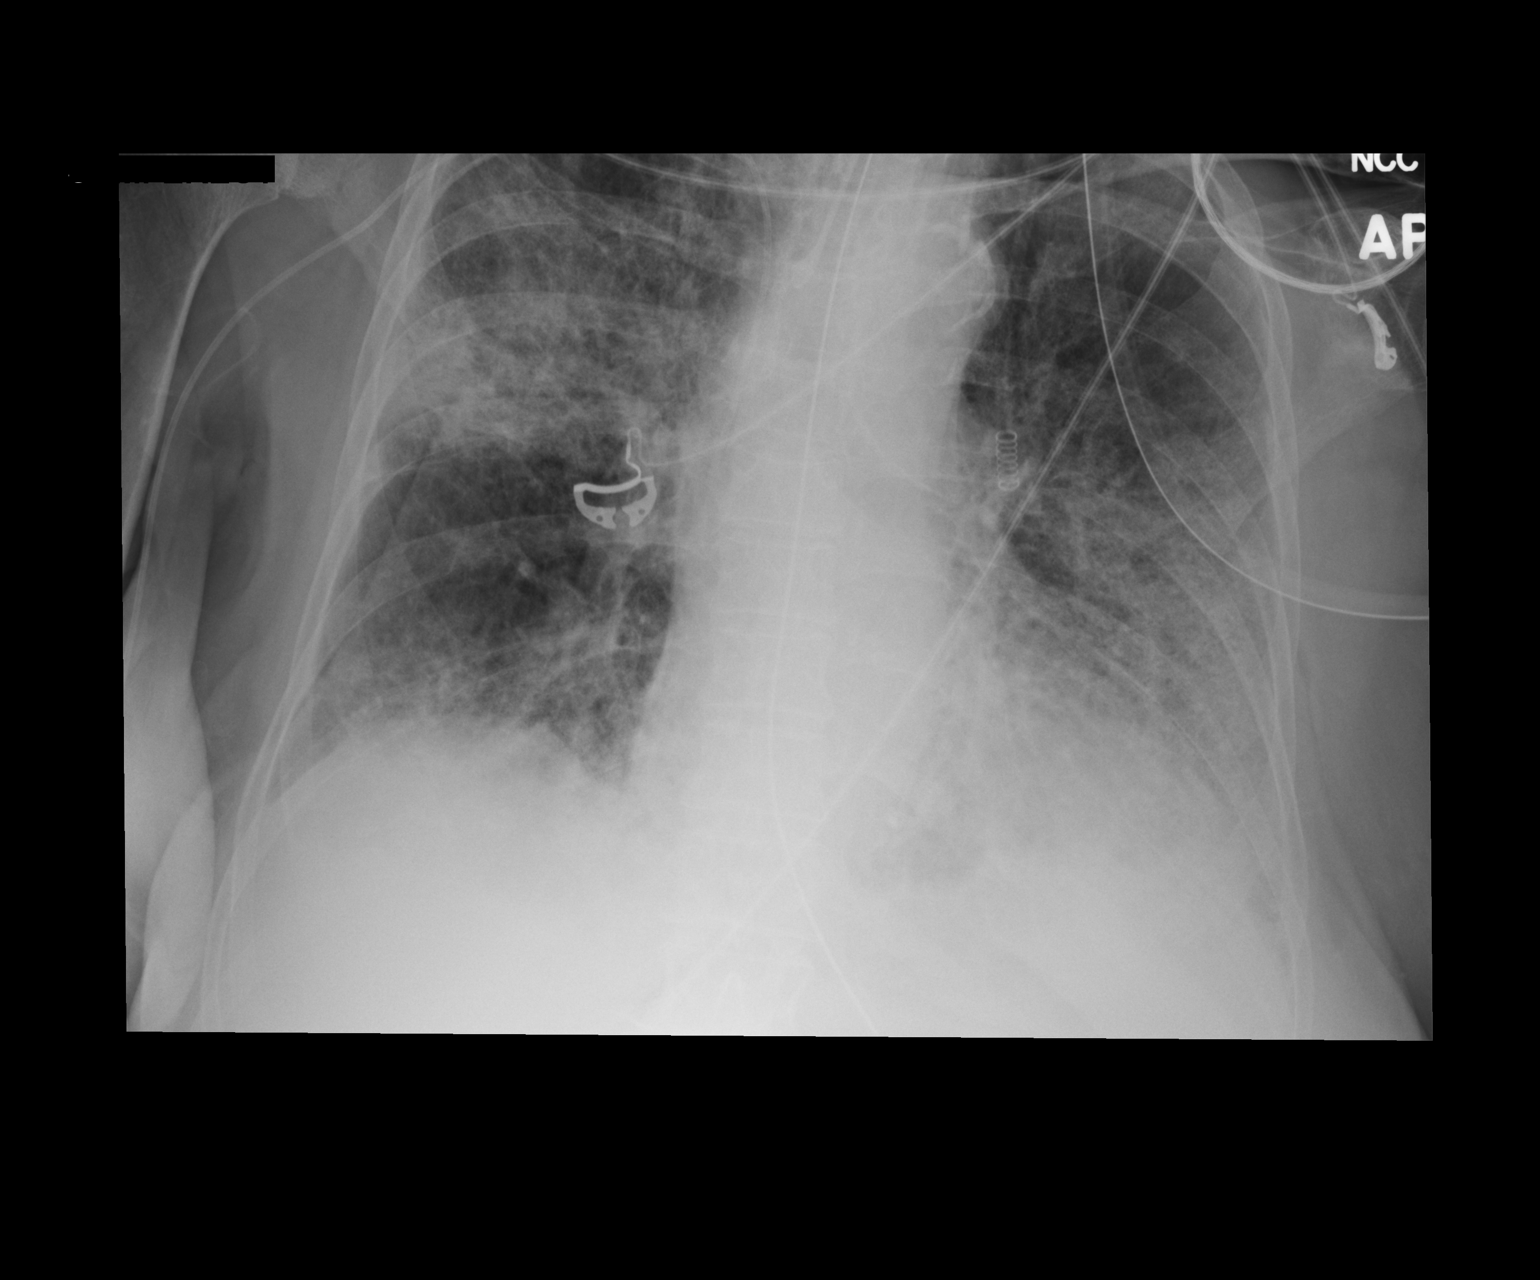

[1 of 1 positions shown; findings below may reference images not displayed]

FINDINGS: Endotracheal tube 5 cm above the carina. Right PICC line tip mid SVC
level. NG tube enters the stomach with the tip not visualized. Left
IJ central line removed. Asymmetric patchy airspace process versus
edema persist. This appears slightly worse in the left mid lung
obscuring the left cardiac border on today's exam. No enlarging
significant effusion or pneumothorax. Atherosclerosis of the aorta.
IMPRESSION: Patchy asymmetric airspace process versus ARDS, slightly worse in
the left midlung. Stable support apparatus.

## 2014-07-13 IMAGING — CR DG CHEST 1V PORT
2 series · 2 of 2 positions shown · non-contrast
Comparison: 01/27/2013

CLINICAL DATA: Respiratory failure.

EXAM:
PORTABLE CHEST - 1 VIEW

[AP (1 of 2)]
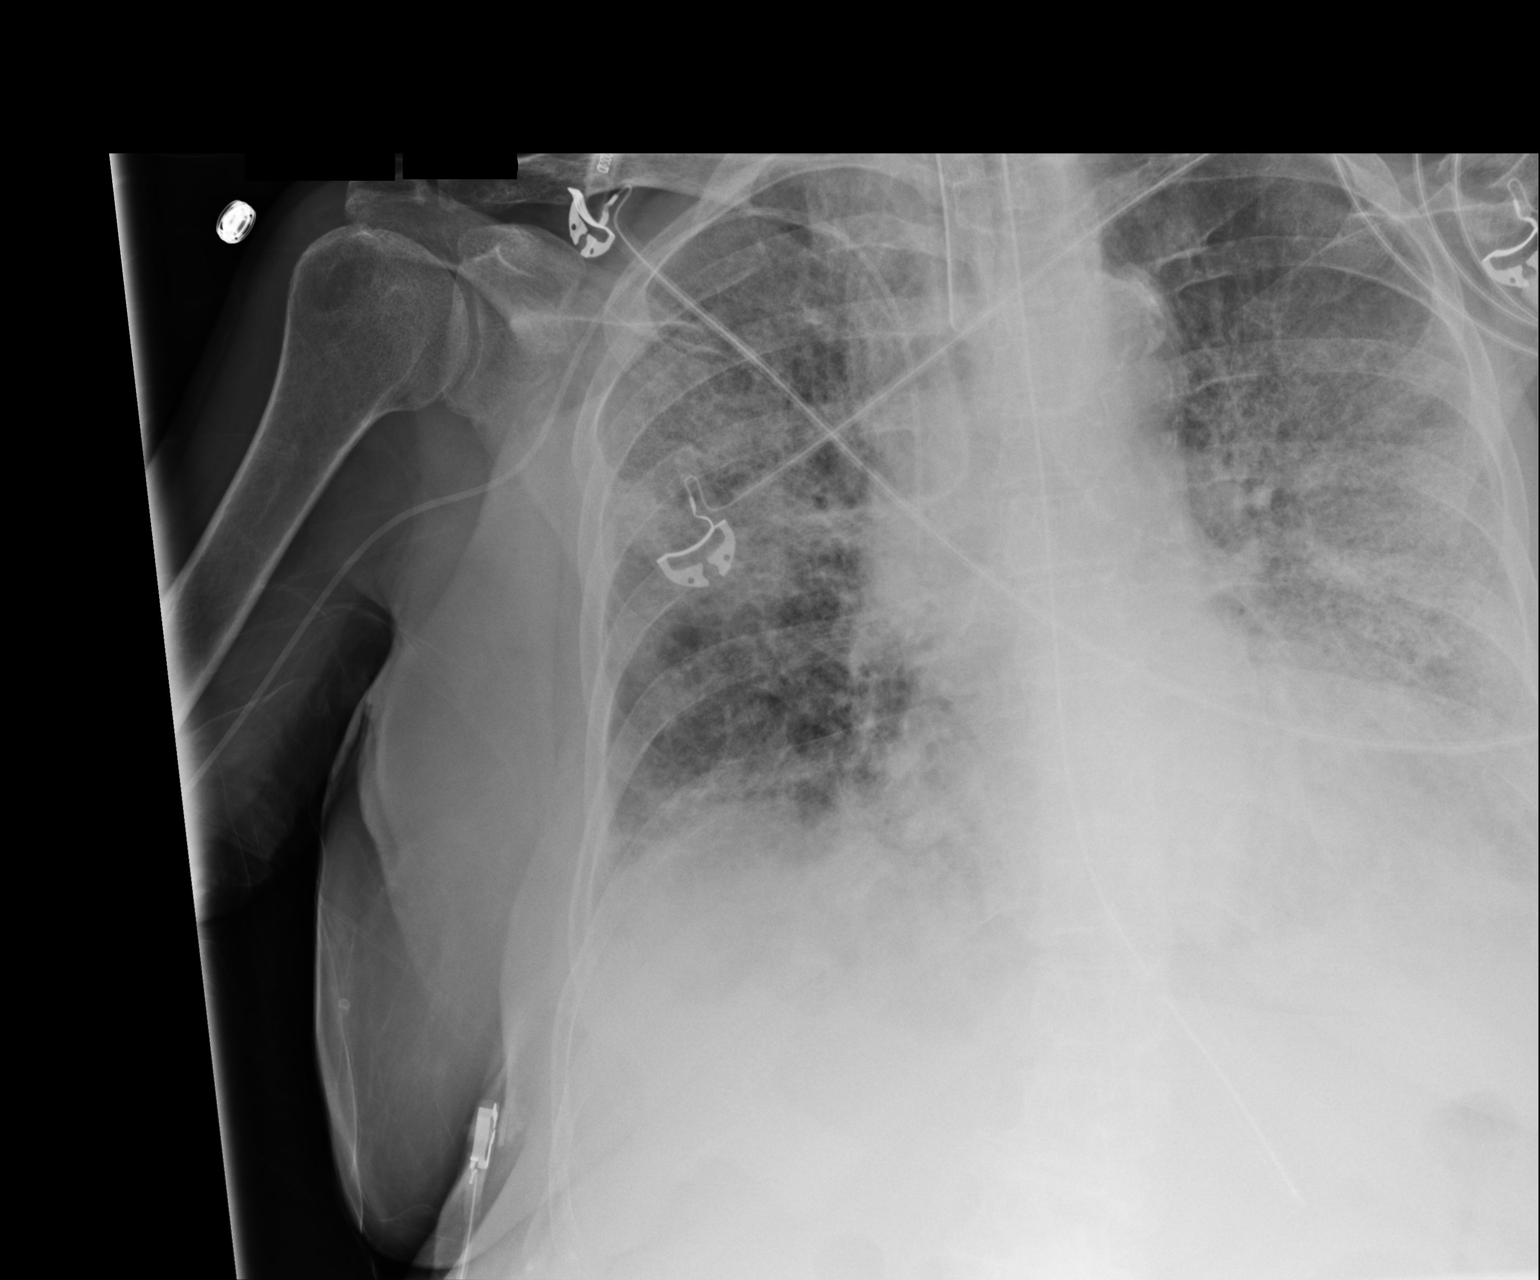

[AP (2 of 2)]
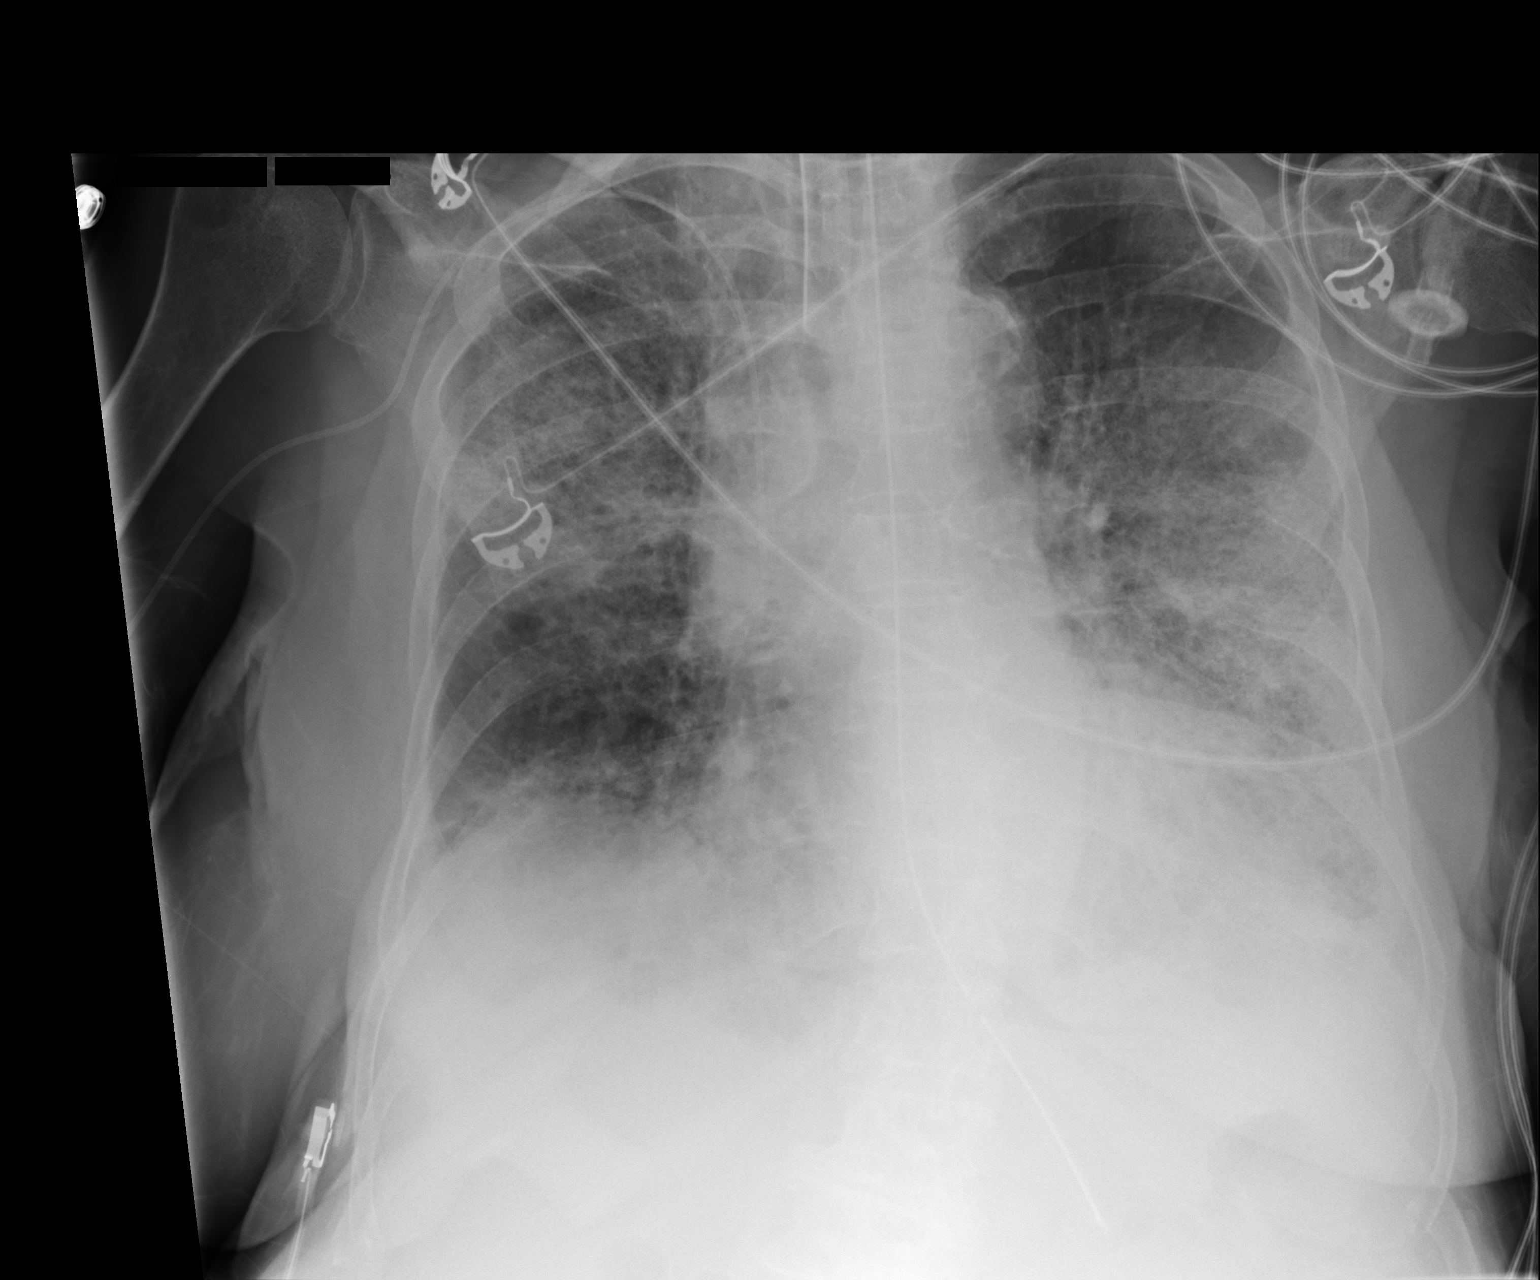

[2 of 2 positions shown; findings below may reference images not displayed]

FINDINGS: Endotracheal tube remains with the tip approximately 4 cm above the
carina. Lungs show stable pneumonia and airspace infiltrates in a
multilobar distribution bilaterally. No significant pleural
effusions. No pneumothorax.
IMPRESSION: Stable bilateral pulmonary infiltrates.
